# Patient Record
Sex: Male | Born: 1975
Health system: Southern US, Community
[De-identification: ages and names within clinical notes are randomized; demographics above are authoritative.]

## PROBLEM LIST (undated history)

## (undated) DIAGNOSIS — F1010 Alcohol abuse, uncomplicated: Secondary | ICD-10-CM

## (undated) DIAGNOSIS — Z720 Tobacco use: Secondary | ICD-10-CM

## (undated) DIAGNOSIS — M549 Dorsalgia, unspecified: Secondary | ICD-10-CM

## (undated) DIAGNOSIS — G510 Bell's palsy: Secondary | ICD-10-CM

## (undated) HISTORY — DX: Bell's palsy: G51.0

## (undated) HISTORY — DX: Dorsalgia, unspecified: M54.9

---

## 2012-01-15 ENCOUNTER — Encounter (HOSPITAL_COMMUNITY): Payer: Self-pay | Admitting: Emergency Medicine

## 2012-01-15 ENCOUNTER — Emergency Department (HOSPITAL_COMMUNITY)
Admission: EM | Admit: 2012-01-15 | Discharge: 2012-01-15 | Disposition: A | Discharge status: Home / Self Care | Payer: BC Managed Care – PPO | Source: Home / Self Care | Attending: Emergency Medicine | Admitting: Emergency Medicine

## 2012-01-15 DIAGNOSIS — L237 Allergic contact dermatitis due to plants, except food: Secondary | ICD-10-CM

## 2012-01-15 DIAGNOSIS — L255 Unspecified contact dermatitis due to plants, except food: Secondary | ICD-10-CM

## 2012-01-15 MED ORDER — DIPHENHYDRAMINE HCL 25 MG PO CAPS: 50.0000 mg | ORAL_CAPSULE | Freq: Once | ORAL | Status: DC

## 2012-01-15 MED ORDER — DIPHENHYDRAMINE HCL 25 MG PO CAPS
50.0000 mg | ORAL_CAPSULE | Freq: Four times a day (QID) | ORAL | Status: DC | PRN
  Administered 2012-01-15: 50 mg via ORAL

## 2012-01-15 MED ORDER — METHYLPREDNISOLONE SODIUM SUCC 125 MG IJ SOLR
125.0000 mg | Freq: Once | INTRAMUSCULAR | Status: AC
  Administered 2012-01-15: 125 mg via INTRAMUSCULAR

## 2012-01-15 MED ORDER — DIPHENHYDRAMINE HCL 25 MG PO CAPS
ORAL_CAPSULE | ORAL | Status: AC
  Filled 2012-01-15: qty 2

## 2012-01-15 MED ORDER — PREDNISONE 10 MG PO TABS: ORAL_TABLET | ORAL | Status: DC

## 2012-01-15 MED ORDER — METHYLPREDNISOLONE SODIUM SUCC 125 MG IJ SOLR
INTRAMUSCULAR | Status: AC
  Filled 2012-01-15: qty 2

## 2012-01-15 NOTE — ED Provider Notes (Signed)
History     CSN: [id number]  Arrival date & time 01/15/12  1542   First MD Initiated Contact with Patient 01/15/12 1758      Chief Complaint  Patient presents with  . Allergic Reaction  . Poison Ivy    (Consider location/radiation/quality/duration/timing/severity/associated sxs/prior treatment) Patient is a 36 y.o. male presenting with allergic reaction and Poison Ivy. The history is provided by the patient. No language interpreter was used.  Allergic Reaction The primary symptoms are  rash. The current episode started 2 days ago. The problem has been gradually worsening. This is a new problem.  The rash is associated with itching.  The onset of the reaction was associated with exposure to plants. Significant symptoms also include itching.  Poison Ivy  Pt has a full body rash.  Rash began after mowing yard  History reviewed. No pertinent past medical history.  History reviewed. No pertinent past surgical history.  No family history on file.  History  Substance Use Topics  . Smoking status: Current Everyday Smoker  . Smokeless tobacco: Not on file  . Alcohol Use: Yes      Review of Systems  Skin: Positive for itching and rash.  All other systems reviewed and are negative.    Allergies  Review of patient's allergies indicates no known allergies.  Home Medications  No current outpatient prescriptions on file.  BP 116/57  Pulse 74  Temp(Src) 97.8 F (36.6 C) (Oral)  Resp 17  SpO2 96%  Physical Exam  Nursing note and vitals reviewed. Constitutional: He is oriented to person, place, and time. He appears well-developed and well-nourished.  HENT:  Head: Normocephalic.  Eyes: Conjunctivae are normal. Pupils are equal, round, and reactive to light.  Neck: Normal range of motion.  Cardiovascular: Normal rate and normal heart sounds.   Pulmonary/Chest: Effort normal.  Abdominal: Soft.  Neurological: He is alert and oriented to person, place, and time. He has  normal reflexes.  Skin: Rash noted. There is erythema.       Linear areas, red raised,  Scattered blistering  Psychiatric: He has a normal mood and affect.    ED Course  Procedures (including critical care time)  Labs Reviewed - No data to display No results found.   1. Poison ivy       MDM  Solumedrol, benadryl        Lonia Skinner [place], [place] 01/15/12 1802  Lonia Skinner [place], [place] 01/15/12 [address] [place], [place] 01/15/12 [phone]

## 2012-01-15 NOTE — ED Notes (Signed)
PT HERE WITH POSS POISON OAK OR BITE BY UNKNOWN INSECT AFTER CUTTING GRASS Saturday.WIFE STATES HIS FACE WAS VERY SWOLLEN AND EYES WATERY BUT SWELLING HAS DECREASED.REDNESS TO FACE,CHEST AND ARM WITH SMALL RED BUMPS ON HANDS APPEARING LIKE SCABIES.PT TRIED OTC CREAM AND EYE DROPS FOR RELIEF.DENIES SOB

## 2012-01-15 NOTE — ED Notes (Signed)
BENADRYL 50 MG PO GIVEN BUT ORDER PLACED FOR Q 6 PRN.

## 2012-01-15 NOTE — Discharge Instructions (Signed)
Poison Ivy Poison ivy is a inflammation of the skin (contact dermatitis) caused by touching the allergens on the leaves of the ivy plant following previous exposure to the plant. The rash usually appears 48 hours after exposure. The rash is usually bumps (papules) or blisters (vesicles) in a linear pattern. Depending on your own sensitivity, the rash may simply cause redness and itching, or it may also progress to blisters which may break open. These must be well cared for to prevent secondary bacterial (germ) infection, followed by scarring. Keep any open areas dry, clean, dressed, and covered with an antibacterial ointment if needed. The eyes may also get puffy. The puffiness is worst in the morning and gets better as the day progresses. This dermatitis usually heals without scarring, within 2 to 3 weeks without treatment. HOME CARE INSTRUCTIONS  Thoroughly wash with soap and water as soon as you have been exposed to poison ivy. You have about one half hour to remove the plant resin before it will cause the rash. This washing will destroy the oil or antigen on the skin that is causing, or will cause, the rash. Be sure to wash under your fingernails as any plant resin there will continue to spread the rash. Do not rub skin vigorously when washing affected area. Poison ivy cannot spread if no oil from the plant remains on your body. A rash that has progressed to weeping sores will not spread the rash unless you have not washed thoroughly. It is also important to wash any clothes you have been wearing as these may carry active allergens. The rash will return if you wear the unwashed clothing, even several days later. Avoidance of the plant in the future is the best measure. Poison ivy plant can be recognized by the number of leaves. Generally, poison ivy has three leaves with flowering branches on a single stem. Diphenhydramine may be purchased over the counter and used as needed for itching. Do not drive with  this medication if it makes you drowsy.Ask your caregiver about medication for children. SEEK MEDICAL CARE IF:  Open sores develop.   Redness spreads beyond area of rash.   You notice purulent (pus-like) discharge.   You have increased pain.   Other signs of infection develop (such as fever).  Document Released: 08/10/2000 Document Revised: 08/02/2011 Document Reviewed: 06/29/2009 Exit[employer] Patient Information 2012 [employer]. 

## 2012-01-16 NOTE — ED Provider Notes (Signed)
Medical screening examination/treatment/procedure(s) were performed by non-physician practitioner and as supervising physician I was immediately available for consultation/collaboration.   Briggs,Donald Briggs; MD   Donald Wolter Moreno-Coll, MD 01/16/12 1114 

## 2013-12-24 ENCOUNTER — Emergency Department (HOSPITAL_COMMUNITY)
Admission: EM | Admit: 2013-12-24 | Discharge: 2013-12-24 | Disposition: A | Discharge status: Home / Self Care | Payer: BC Managed Care – PPO | Attending: Emergency Medicine | Admitting: Emergency Medicine

## 2013-12-24 ENCOUNTER — Encounter (HOSPITAL_COMMUNITY): Payer: Self-pay | Admitting: Emergency Medicine

## 2013-12-24 ENCOUNTER — Emergency Department (HOSPITAL_COMMUNITY): Payer: BC Managed Care – PPO

## 2013-12-24 ENCOUNTER — Emergency Department (INDEPENDENT_AMBULATORY_CARE_PROVIDER_SITE_OTHER)
Admission: EM | Admit: 2013-12-24 | Discharge: 2013-12-24 | Disposition: A | Discharge status: Nursing Home (Includes ICF) | Payer: BC Managed Care – PPO | Source: Home / Self Care | Attending: Emergency Medicine | Admitting: Emergency Medicine

## 2013-12-24 DIAGNOSIS — R0789 Other chest pain: Secondary | ICD-10-CM

## 2013-12-24 DIAGNOSIS — F10229 Alcohol dependence with intoxication, unspecified: Secondary | ICD-10-CM | POA: Insufficient documentation

## 2013-12-24 DIAGNOSIS — M549 Dorsalgia, unspecified: Secondary | ICD-10-CM | POA: Insufficient documentation

## 2013-12-24 DIAGNOSIS — R071 Chest pain on breathing: Secondary | ICD-10-CM | POA: Insufficient documentation

## 2013-12-24 DIAGNOSIS — K2920 Alcoholic gastritis without bleeding: Secondary | ICD-10-CM | POA: Insufficient documentation

## 2013-12-24 DIAGNOSIS — F17200 Nicotine dependence, unspecified, uncomplicated: Secondary | ICD-10-CM | POA: Insufficient documentation

## 2013-12-24 DIAGNOSIS — R109 Unspecified abdominal pain: Secondary | ICD-10-CM

## 2013-12-24 DIAGNOSIS — F1010 Alcohol abuse, uncomplicated: Secondary | ICD-10-CM

## 2013-12-24 LAB — COMPREHENSIVE METABOLIC PANEL
ALBUMIN: 2.9 g/dL — AB (ref 3.5–5.2)
ALK PHOS: 226 U/L — AB (ref 39–117)
ALT: 61 U/L — AB (ref 0–53)
AST: 115 U/L — ABNORMAL HIGH (ref 0–37)
BUN: 13 mg/dL (ref 6–23)
CO2: 23 mEq/L (ref 19–32)
Calcium: 8.4 mg/dL (ref 8.4–10.5)
Chloride: 97 mEq/L (ref 96–112)
Creatinine, Ser: 0.7 mg/dL (ref 0.50–1.35)
GFR calc Af Amer: >90 mL/min (ref >90)
GFR calc non Af Amer: >90 mL/min (ref >90)
Glucose, Bld: 83 mg/dL (ref 70–99)
POTASSIUM: 4.7 meq/L (ref 3.7–5.3)
SODIUM: 134 meq/L — AB (ref 137–147)
TOTAL PROTEIN: 7.9 g/dL (ref 6.0–8.3)
Total Bilirubin: 0.9 mg/dL (ref 0.3–1.2)

## 2013-12-24 LAB — RAPID URINE DRUG SCREEN, HOSP PERFORMED
Amphetamines: NOT DETECTED
BARBITURATES: NOT DETECTED
BENZODIAZEPINES: NOT DETECTED
COCAINE: NOT DETECTED
Opiates: NOT DETECTED
TETRAHYDROCANNABINOL: NOT DETECTED

## 2013-12-24 LAB — CBC
HCT: 38.1 % — ABNORMAL LOW (ref 39.0–52.0)
Hemoglobin: 12.8 g/dL — ABNORMAL LOW (ref 13.0–17.0)
MCH: 30.3 pg (ref 26.0–34.0)
MCHC: 33.6 g/dL (ref 30.0–36.0)
MCV: 90.3 fL (ref 78.0–100.0)
PLATELETS: 83 10*3/uL — AB (ref 150–400)
RBC: 4.22 MIL/uL (ref 4.22–5.81)
RDW: 15.9 % — ABNORMAL HIGH (ref 11.5–15.5)
WBC: 5.3 10*3/uL (ref 4.0–10.5)

## 2013-12-24 LAB — LIPASE, BLOOD: LIPASE: 53 U/L (ref 11–59)

## 2013-12-24 LAB — ETHANOL: Alcohol, Ethyl (B): 14 mg/dL — ABNORMAL HIGH (ref 0–11)

## 2013-12-24 LAB — I-STAT TROPONIN, ED: TROPONIN I, POC: 0 ng/mL (ref 0.00–0.08)

## 2013-12-24 MED ORDER — ONDANSETRON HCL 4 MG PO TABS: 4.0000 mg | ORAL_TABLET | Freq: Three times a day (TID) | ORAL | Status: DC | PRN

## 2013-12-24 MED ORDER — SODIUM CHLORIDE 0.9 % IV BOLUS (SEPSIS)
1000.0000 mL | Freq: Once | INTRAVENOUS | Status: AC
  Administered 2013-12-24: 1000 mL via INTRAVENOUS

## 2013-12-24 MED ORDER — IBUPROFEN 400 MG PO TABS: 600.0000 mg | ORAL_TABLET | Freq: Three times a day (TID) | ORAL | Status: DC | PRN

## 2013-12-24 MED ORDER — PANTOPRAZOLE SODIUM 20 MG PO TBEC: 20.0000 mg | DELAYED_RELEASE_TABLET | Freq: Every day | ORAL | Status: DC

## 2013-12-24 MED ORDER — ALUM & MAG HYDROXIDE-SIMETH 200-200-20 MG/5ML PO SUSP: 30.0000 mL | ORAL | Status: DC | PRN

## 2013-12-24 MED ORDER — ONDANSETRON HCL 4 MG/2ML IJ SOLN
4.0000 mg | Freq: Once | INTRAMUSCULAR | Status: AC
  Administered 2013-12-24: 4 mg via INTRAVENOUS
  Filled 2013-12-24: qty 2

## 2013-12-24 MED ORDER — NICOTINE 21 MG/24HR TD PT24: 21.0000 mg | MEDICATED_PATCH | Freq: Every day | TRANSDERMAL | Status: DC

## 2013-12-24 MED ORDER — LORAZEPAM 1 MG PO TABS: 1.0000 mg | ORAL_TABLET | Freq: Three times a day (TID) | ORAL | Status: DC | PRN

## 2013-12-24 MED ORDER — ZOLPIDEM TARTRATE 5 MG PO TABS: 5.0000 mg | ORAL_TABLET | Freq: Every evening | ORAL | Status: DC | PRN

## 2013-12-24 MED ORDER — KETOROLAC TROMETHAMINE 30 MG/ML IJ SOLN
30.0000 mg | Freq: Once | INTRAMUSCULAR | Status: AC
  Administered 2013-12-24: 30 mg via INTRAVENOUS
  Filled 2013-12-24: qty 1

## 2013-12-24 MED ORDER — ONDANSETRON 4 MG PO TBDP: ORAL_TABLET | ORAL | Status: DC

## 2013-12-24 NOTE — ED Provider Notes (Signed)
Medical screening examination/treatment/procedure(s) were performed by non-physician practitioner and as supervising physician I was immediately available for consultation/collaboration.  Leslee Homeavid Gaylon Bentz, M.D.  Reuben Likesavid C Sheryle Vice, MD 12/24/13 [phone]

## 2013-12-24 NOTE — ED Notes (Signed)
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

## 2013-12-24 NOTE — ED Provider Notes (Signed)
CSN: [id number]     Arrival date & time 12/24/13  1045 History   First MD Initiated Contact with Patient 12/24/13 1140     Chief Complaint  Patient presents with  . Chest Pain     (Consider location/radiation/quality/duration/timing/severity/associated sxs/prior Treatment) HPI Comments: Patient is a 38 year old Hispanic male with a past medical history of alcohol abuse for the past 10 years who was to the emergency department from urgent care Center with his wife for evaluation of possible alcoholic gastritis and/or pancreatitis and medical clearance for alcohol detox program. He went to urgent care complaining of left-sided chest wall discomfort, it was concluded that it is chest wall pain. Chest pain has been present for 3 days, he works at a [employer] and lives heavy objects throughout the day. Pain worse when he moves his left arm or lifts anything. Denies associated shortness breath. States he's been drinking alcohol for the past 10 years, he has 3-4 40 ounce beers daily. Denies drug use. He reports he would like to attend day treatment facility for his alcohol abuse. Currently he is complaining of severe upper abdominal pain for the past 4 days. Pain radiates across his upper abdomen into the right side of his back. Admits to associated nausea and multiple episodes of nonbloody emesis. Admits to diarrhea that floats to the top of the toilet. Denies fever or chills. No suicidal or homicidal ideations.  Patient is a 38 y.o. male presenting with chest pain. The history is provided by the patient and the spouse. The history is limited by a language barrier. A language interpreter was used.  Chest Pain Associated symptoms: abdominal pain, back pain, nausea and vomiting     History reviewed. No pertinent past medical history. History reviewed. No pertinent past surgical history. History reviewed. No pertinent family history. History  Substance Use Topics  . Smoking status: Current Every  Day Smoker  . Smokeless tobacco: Not on file  . Alcohol Use: Yes    Review of Systems  Constitutional: Positive for appetite change.  Cardiovascular: Positive for chest pain.  Gastrointestinal: Positive for nausea, vomiting, abdominal pain and diarrhea.  Musculoskeletal: Positive for back pain.  Psychiatric/Behavioral:       Positive for alcohol abuse.  All other systems reviewed and are negative.     Allergies  Review of patient's allergies indicates no known allergies.  Home Medications   Prior to Admission medications   Not on File   BP 141/87  Pulse 64  Temp(Src) 99.4 F (37.4 C) (Oral)  Resp 22  SpO2 98% Physical Exam  Nursing note and vitals reviewed. Constitutional: He is oriented to person, place, and time. He appears well-developed and well-nourished. No distress.  HENT:  Head: Normocephalic and atraumatic.  Mouth/Throat: Oropharynx is clear and moist.  Eyes: Conjunctivae are normal.  Neck: Normal range of motion. Neck supple.  Cardiovascular: Normal rate, regular rhythm and normal heart sounds.   Pulmonary/Chest: Effort normal and breath sounds normal.    Abdominal: Soft. Bowel sounds are normal. He exhibits no distension. There is tenderness in the right upper quadrant, epigastric area and left upper quadrant. There is guarding and positive Murphy's sign. There is no rigidity and no rebound.  No peritoneal signs.  Musculoskeletal: Normal range of motion. He exhibits no edema.  Neurological: He is alert and oriented to person, place, and time.  Skin: Skin is warm and dry. He is not diaphoretic.  Psychiatric: He has a normal mood and affect. His behavior is  normal.    ED Course  Procedures (including critical care time) Labs Review Labs Reviewed  CBC - Abnormal; Notable for the following:    Hemoglobin 12.8 (*)    HCT 38.1 (*)    RDW 15.9 (*)    Platelets 83 (*)    All other components within normal limits  COMPREHENSIVE METABOLIC PANEL -  Abnormal; Notable for the following:    Sodium 134 (*)    Albumin 2.9 (*)    AST 115 (*)    ALT 61 (*)    Alkaline Phosphatase 226 (*)    All other components within normal limits  ETHANOL - Abnormal; Notable for the following:    Alcohol, Ethyl (B) 14 (*)    All other components within normal limits  LIPASE, BLOOD  URINE RAPID DRUG SCREEN (HOSP PERFORMED)  I-STAT TROPOININ, ED    Imaging Review Dg Chest 2 View  12/24/2013   CLINICAL DATA:  Chest pain  EXAM: CHEST  2 VIEW  COMPARISON:  None.  FINDINGS: The heart size and mediastinal contours are within normal limits. Both lungs are clear. The visualized skeletal structures are unremarkable.  IMPRESSION: No active cardiopulmonary disease.   Electronically Signed   By: Elige KoHetal  Patel   On: 12/24/2013 12:35   [place] Abdomen Complete  12/24/2013   CLINICAL DATA:  Atypical chest pain.  Epigastric pain.  EXAM: ULTRASOUND ABDOMEN COMPLETE  COMPARISON:  None.  FINDINGS: Gallbladder:  No gallstones or wall thickening visualized. No sonographic Murphy sign noted.  Common bile duct:  Diameter: 4 mm  Liver:  No focal lesion identified. Within normal limits in parenchymal echogenicity.  IVC:  No abnormality visualized.  Pancreas:  Visualized portion unremarkable.  Spleen:  Size and appearance within normal limits.  Right Kidney:  Length: 11.7 cm. Echogenicity within normal limits. No mass or hydronephrosis visualized.  Left Kidney:  Length: 11.0 cm. Echogenicity within normal limits. No mass or hydronephrosis visualized.  Abdominal aorta:  No aneurysm visualized.  Other findings:  None.  IMPRESSION: Negative. No evidence of gallstones, hydronephrosis, or other significant abnormality.   Electronically Signed   By: Myles RosenthalJohn  Stahl M.D.   On: 12/24/2013 15:57     EKG Interpretation None      MDM   Final diagnoses:  Alcoholic gastritis  Alcohol abuse  Chest wall pain   [facility]coholic gast[facility]. He appears in NAD.  Temp 99.4, vitals otherwise stable. Labs obtained prior to patient being seen, normal lipase, however elevated liver functions. Tenderness right upper quadrant, epigastric or positive Murphy sign. Will obtain abdominal ultrasound. Once medically cleared, will consult TTS for evaluation. 4:18 PM Abdominal ultrasound negative. Patient's pain has improved, abdomen still slightly tender midepigastric area. Most likely alcoholic gastritis. Patient medically cleared for evaluation for detox. 5:46 PM When speaking with Paige at behavioral health, patient reports that [place] He needs a note for work that he is medically cleared to attend to this facility. Stable for discharge, patient is not a harm to himself or others. Zofran and Protonix prescriptions given. Return precautions given. Patient states understanding of treatment care plan and is agreeable.  Case discussed with attending Dr. Bernette MayersSheldon who agrees with plan of care.   Trevor Maceobyn M Albert, PA-C 12/24/13 1747  Trevor Maceobyn M Albert, PA-C 12/24/13 [phone]

## 2013-12-24 NOTE — ED Notes (Signed)
Notified pt that he will need to change into blue paper scrubs and will be wanded by security. Pt's wife will take his belongings home with her.

## 2013-12-24 NOTE — ED Notes (Addendum)
Behavior Health called to schedule assessment at 16:55. Computer brought to patient's room at this time. ACT team made aware pt does not speak [place]

## 2013-12-24 NOTE — ED Provider Notes (Signed)
CSN: [id number]     Arrival date & time 12/24/13  0932 History   First MD Initiated Contact with Patient 12/24/13 1012     Chief Complaint  Patient presents with  . Chest Pain   (Consider location/radiation/quality/duration/timing/severity/associated sxs/prior Treatment) HPI Comments: Patient presents with 3 separate issues for evaluation. First, he states he has had 3 days of left sided chest wall discomfort, primarily at left pectoralis muscle. States he works in a [employer] and performs heavy lifting all day while at work. Feels he has strained his chest wall. Denies dyspnea or diaphoresis. Symptoms are exacerbated by use of left upper extremity or chest muscles for lifting. Next he requests placement in inpatient substance abuse program. Has abused alcohol for >10 years, drinking at least 3-4, 40oz beers a day. Reports recent DUI (Dec. 2014) and was advised by judge to enter treatment. Last drink within the past 12 hours. Is a smoker but denies illicit drug use. Has never received treatment before. States he has daily nosebleeds and feels that this is related to his alcohol use Lastly he is concerned about several days of epigastric abdominal pain with associated nausea and vomiting. Pain is constant and radiates through to his back. Denies previous abdominal surgery or previous episodes of same. No fever or diarrhea. Pain is exacerbated by movement. Denies GU sx.   The history is provided by the patient and the spouse. The history is limited by a language barrier. A language interpreter was used.    History reviewed. No pertinent past medical history. History reviewed. No pertinent past surgical history. No family history on file. History  Substance Use Topics  . Smoking status: Current Every Day Smoker  . Smokeless tobacco: Not on file  . Alcohol Use: Yes    Review of Systems  Constitutional: Negative.   HENT: Positive for nosebleeds.   Eyes: Negative.   Respiratory:  Negative.   Cardiovascular: Positive for chest pain.       See HPI  Gastrointestinal: Positive for nausea, vomiting and abdominal pain. Negative for diarrhea, constipation, blood in stool and anal bleeding.  Endocrine: Negative for polydipsia, polyphagia and polyuria.  Genitourinary: Negative.   Musculoskeletal: Positive for back pain.  Skin: Negative.   Neurological: Negative.   Hematological: Bruises/bleeds easily.  Psychiatric/Behavioral:       See HPI    Allergies  Review of patient's allergies indicates no known allergies.  Home Medications   Prior to Admission medications   Medication Sig Start Date End Date Taking? Authorizing Provider  predniSONE (DELTASONE) 10 MG tablet 6,6,5,5,4,4,3,3,2,2,1,1 taper 01/15/12   Lonia SkinnerLeslie K Sofia, PA-C   BP 118/73  Pulse 88  Temp(Src) 100.8 F (38.2 C) (Oral)  Resp 14  SpO2 98% Physical Exam  Nursing note and vitals reviewed. Constitutional: He is oriented to person, place, and time. He appears well-developed and well-nourished. No distress.  +febrile  HENT:  Head: Normocephalic and atraumatic.  Nose: Nose normal. No epistaxis.  Eyes: Conjunctivae are normal. No scleral icterus.  Cardiovascular: Normal rate, regular rhythm and normal heart sounds.   Pulmonary/Chest: Effort normal and breath sounds normal. No respiratory distress. He has no wheezes. He exhibits tenderness.  +tenderness with palpation of left pectoralis muscle  Abdominal: Soft. Normal appearance and bowel sounds are normal. He exhibits no distension, no pulsatile liver, no ascites and no mass. There is no hepatosplenomegaly. There is tenderness in the right upper quadrant and epigastric area. There is no rigidity, no rebound, no guarding, no CVA  tenderness, no tenderness at McBurney's point and negative Murphy's sign. No hernia. Hernia confirmed negative in the ventral area.  Musculoskeletal: Normal range of motion.  Neurological: He is alert and oriented to person, place,  and time.  Skin: Skin is warm and dry. No rash noted. No erythema.  Psychiatric: He has a normal mood and affect. His behavior is normal.    ED Course  Procedures (including critical care time) Labs Review Labs Reviewed - No data to display  Imaging Review No results found.   MDM   1. Chest wall pain   2. Abdominal pain   3. Alcohol abuse    Will need to proceed to ER for evaluation for possible pancreatitis and/or alcoholic gastritis and medical clearance for possible alcohol detox program. Chest discomfort would appear to be of musculoskeletal origin. ECG: NSR @ 76 BPM and without acute ST/T wave changes or ectopy No clinical evidence of DTs.   Jess BartersJennifer Lee [place], [place] 12/24/13 [phone]

## 2013-12-24 NOTE — Discharge Instructions (Signed)
Tome protonix segn las indicaciones. Tome zofran segn las indicaciones segn sea necesario para las nuseas .  Problemas Con El Alcohol (Alcohol Problems) La mayora de los adultos que beben alcohol lo hacen con moderacin (no demasiado) y poseen bajo riesgo de Warehouse manager problemas relacionados con la bebida. Sin embargo, todos los bebedores, inclusive los de bajo riesgo, Gaffer los riesgos de la salud que conlleva la ingesta de alcohol. RECOMENDACIONES PARA LOS BEBEDORES DE BAJO RIESGO Beber con moderacin. La ingesta moderada de alcohol se establece de la siguiente manera:   Hombres  no ms de dos tragos Google.  Mujeres  no ms de un trago Google.  Mayores de 65 aos  no ms de un trago Air cabin crew. Un trago estndar es 12 gramos de alcohol puro, lo que es lo mismo que una botella de 12 onzas de cerveza o Merchant navy officer, un vaso de vino de 5 onzas, o 1 onza y media de bebidas blancas (como whisky, brandy, vodka, o ron).  ABSTNGASE (NO BEBA) ALCOHOL:  Si est embarazada o contempla la posibilidad.  Cuando tome un medicamento que interacta con el alcohol.  Si usted es dependiente del alcohol.  Sufre alguna enfermedad en la que se prohba el consumo de alcohol (como lceras, enfermedades hepticas, o enfermedades cardacas). HABLE CON EL MDICO:  Si tiene riesgo de sufrir una enfermedad coronaria, converse sobre los potenciales beneficios y riesgos de la ingesta de alcohol: La ingesta baja a moderada de alcohol est asociada con tasas menores de enfermedades coronarias en ciertas poblaciones (por ejemplo, hombres mayores de 45 aos y mujeres postmenopusicas). Se aconseja a las personas no bebedoras o abstemias no comenzar con la ingesta baja a moderada para reducir el riesgo de enfermedades coronarias en funcin de evitar la aparicin de un problema relacionado con el alcohol. Pueden obtenerse efectos protectores similares a travs de una dieta y ejercicios adecuados.  Las mujeres y los  ancianos tienen menos cantidad de agua en el [employer]. Como consecuencia de [place], las mujeres y los ancianos tienen mayor concentracin de alcohol en la sangre despus de beber la misma cantidad de alcohol.  La exposicin del feto al alcohol puede ocasionar una gran cantidad de defectos de nacimientos dominados [place] Alcohlico Fetal (FAS) o Defectos de Nacimiento Relacionados con el Alcohol (ARBD). Aunque los FAS y los ARBD estn asociados con el consumo excesivo de alcohol durante el [place]­, tambin se han informado trastornos de Leisure centre manager en nios nacidos de madres que informaron haber bebido un trago en promedio por Mudlogger.  El abuso de alcohol (como el consumo de ms de cuatro tragos por ocasin en hombres y ms de tres tragos por ocasin en mujeres) perjudica a lo cognitivo (aprendizaje) y las funciones psicomotoras e incrementa el riesgo de problemas relacionados con el alcohol, inclusive accidentes y daos. PREGUNTAS CECA:   Algunas vez ha sentido que deba cortar con la bebida?  Se ha enojado usted con alguien que lo critic por lo que bebe?  Alguna vez se ha sentido mal o culpable por lo que bebe?  Ha tomado alguna vez un trago por la maana para calmar sus nervios o para deshacerse de una "resaca" (para "abrir los ojos")? Si ha respondido de [place] positiva a [place] de estas preguntas: Podra estar en riesgo de tener problemas relacionados con el alcohol si el consumo del mismo es:   Hombres: Mayor a 14 tragos por semana o ms de 4 tragos por ocasin.  Mujeres:  Mayor a 7 tragos por semana o ms de 3 tragos por ocasin. Tiene usted o su familia alguna historia clnica de problemas relacionados con el alcohol como:  Prdida del conocimiento.  Disfuncin sexual.  Depresin.  Traumatismos.  Enfermedades hepticas.  Trastornos del sueo.  Hipertensin arterial.  Dolor crnico abdominal.  Alguna vez el beber le ha ocasionado  problemas, como por ejemplo con su familia, en su rendimiento laboral (o escolar), accidentes o lesiones?  Ha tenido una compulsin a beber o se ha preocupado mientras lo haca?  Posee poco control o es incapaz de parar de beber una vez que ha comenzado?  Ha tenido que beber para evitar sntomas de abstinencia?  Ha tenido problemas con la abstinencia como temblores, nuseas, sudor o cambios en el humor?  Necesita ms alcohol que antes para emborracharse?  Siente una fuerte necesidad de beber?  Cambia de planes para poder beber?  Alguna vez ha bebido en la maana para aliviar temblores o resaca? Si ha respondido a Jerseyalguna de estas preguntas de Emerson Electricmanera positiva, puede que sea el momento de hablar con un profesional, familiar o amigos y ver si ellos creen que tiene un problema. El alcoholismo es una dependencia qumica que puede Theme park managerempeorar y Environmental health practitionerllegar a destruir su salud y Teacher, English as a foreign languagesus relaciones. Muchos alcohlicos mueren, se empobrecen o terminan en prisin. Esto es a menudo el resultado de una dependencia qumica.  No se desaliente si no est listo para actuar inmediatamente.  Las decisiones para cambiar su comportamiento a menudo implican altas y bajas entre el deseo de Saint Barthelemycambiar y la sensacin de que no puede decidirse.  Intente pensar ms seriamente sobre su comportamiento frente a la bebida.  Piense en razones para dejarla. PARA OBTENER INFORMACIN ADICIONAL, CONCURRIR A:  The General Millsational Institute on Alcohol Abuse and Alcoholism (NIAAA) BasicStudents.dkwww.niaaa.nih.gov   ToysRusational Council on Alcoholism and Drug Dependence (NCADD) www.ncadd.org  American Society of Addiction Medicine (ASAM) RoyalDiary.glwww.asam.org  Document Released: 11/20/2007 Document Revised: 11/05/2011 [facility] Patient Information 2014 [place]ExitCare, [place]LLC.  Sndrome de abstinencia alcohlica (Alcohol Withdrawal) Cuando el consumo de drogas interfiere con las actividades normales de la vida se convierte en abuso. Esto incluye problemas con la  familia y los amigos. La dependencia psicolgica existe cuando su mente le dice que necesita la droga. Esto generalmente es seguido por la dependencia fsica, que se produce al aumentar de Regions Financial Corporationmanera continua la cantidad de droga necesaria para Systems analystalcanzar el mismo efecto o el estado de "euforia". Esto se conoce como adiccin o dependencia qumica. El riesgo de [place]a persona es mucho mayor si existen antecedentes de dependencia de qumicos en la familia. Sndrome de abstinencia leve al abandonar el consumo de alcohol cuando se ha desarrollado la adiccin o la dependencia qumica. Cuando una persona desarrolla la tolerancia al alcohol y de modo repentino deja de consumirlo, puede sufrir sntomas fsicos molestos. Geralmente son leves y Art therapistconsisten en temblores en las manos y aumento de la frecuencia cardiaca, de la respiracin y de Retail buyerla temperatura. Algunas veces estos sntomas se asocian a una sensacin de ansiedad, crisis de [place]angustia y pesadillas. Tambin Lobbyistpuede haber malestar de Lake Californiaestmago. Los patrones normales de sueo generalmente se interrumpen con perodos de insomnio (imposibilidad para dormir). Estos sntomas pueden tener una duracin de 6 meses. Debido a estas molestias, muchas personas eligen continuar bebiendo para evitarlas y para tratar de sentirse normal.  Sndrome de abstinencia grave ante la disminucin de la ingesta o sin consumo de alcohol, cuando se ha desarrollado la adiccin o la dependencia qumica. Alrededor  del cinco por ciento de los alcohlicos desarrollan signos de abstinencia grave cuando interrumpen el consumo de alcohol. Uno de los signos es la aparicin de convulsiones generalizadas Otros signos son agitacin grave y confusin. Esto puede asociarse a las ideas delirantes y alucinaciones (la creencia en cosas que no son reales o ver cosas que no existen). Si la ingesta de alcohol se realiz durante un tiempo prolongado, puede haber deficiencias vitamnicas. Generalmente el tratamiento para esto  requiere la hospitalizacin y un seguimiento intenso. Slo se puede ayudar a una persona que padece una adiccin si suspende el consumo de todas las sustancias qumicas. Esto es difcil de llevar a cabo, pero puede salvar su vida. Algunas consecuencias probables del consumo continuo de alcohol son la prdida de la autoestima, la violencia y Musician. La adiccin no puede curarse pero puede detenerse. A menudo esto requiere ayuda externa y la atencin profesional. Los centros de tratamiento estn enumerados en las pginas amarillas bajo el rubro: [place], Narcticos y Alcohlicos Annimos. Casi todos los hospitales y clnicas pueden derivarlo a un centro de atencin especializada. No es necesario que usted soporte los sntomas desagradables de la abstinencia. El profesional que lo asiste puede proporcionarle la medicacin que lo ayudar a Engineer, agricultural este perodo difcil. Trate de evitar las situaciones, los amigos o las drogas que hicieron posible que usted consumiera alcohol en el pasado. Aprenda a decir que no. Lleva un largo tiempo superar las adicciones a cualquier droga, incluso al alcohol. Puede haber momentos en los que usted sienta que necesita beber. Despus de liberarse de la adiccin fsica y del sndrome de abstinencia, sentir una disminucin en el deseo intenso que le indica que usted necesita alcohol para sentirse normal. Comunquese con el profesional que lo asiste si necesita ms apoyo Aprenda a Public house manager con quien debe hablar dentro de su familia y entre sus [place], de modo que durante estos perodos pueda recibir ayuda externa. AA (Alcohlicos Annimos) ha ayudado a Agricultural engineer. Para obtener ms ayuda contctese con AA o comunquese con el profesional que lo asiste, consejero o sacerdote. Al-Anon y Alateen son grupos de ayuda para amigos y familiares de Optometrist. Las [employer] Lilly and [employer] aman y cuidan a los adictos al alcohol tambin [place]. Para obtener  informacin acerca de estas organizaciones, bsquelas en la gua telefnica de su localidad o llame a un centro local para el tratamiento del alcoholismo.  SOLICITE ATENCIN MDICA DE INMEDIATO SI:  Sufre convulsiones.  Sube la fiebre.  Sufre vmitos incontrolables o vomita sangre. Puede ser sangre de color rojo brillante o similar a borra de caf.  Maxie Better en la materia fecal. Puede ser de color rojo brillante o de aspecto alquitranado, con olor ftido.  Si se siente confundido o desfalleciente. No conduzca si se siente de Sc[employer]. Deje que alguien conduzca por usted o llame al 911 para solicitar ayuda.  Se torna agitado o presenta un estado de confusin.  Desarrolla una ansiedad incontrolable.  Los pacientes pueden sufrir alucinaciones (ver, Tax adviser o sentir cosas que en realidad no existen). El profesional que lo asiste ha determinado que usted comprende plenamente su problema mdico y que su estado mental ha vuelto a la normalidad Comprende que ha recibido un tratamiento para el sndrome de abstinencia por alcohol, ha aceptado no beber alcohol al menos por un da, que no conducir un automvil ni manipular maquinarias durante al menos 24 horas y que ha tenido la oportunidad de formular todas las preguntas necesarias  acerca de su problema. Document Released: 08/13/2005 Document Revised: 11/05/2011 [facility] Patient Information 2014 [place] CityExitCare, [place]LLC.  Dependencia qumica  (Chemical Dependency)  La dependencia qumica es una forma de adiccin a las drogas o al alcohol. Se caracteriza por la conducta repetida de bsqueda y Italyuso de drogas y alcohol a pesar de las consecuencias dainas para la salud y la seguridad propia y Engineer, agriculturalla de Economistotras personas.  FACTORES DE RIESGO  Ciertas situaciones o conductas aumentan el riesgo de sufrir dependencia qumica. Ellos son:   Scheryl DarterHistoria familiar de dependencia qumica.  Historia de trastornos Deep Rivermentales, como depresin o ansiedad.  Ambiente familiar  en los que las drogas y el alcohol estn fcilmente disponibles.  Consumo de alcohol o drogas a edades tempranas. SNTOMAS  Los siguientes sntomas indican dependencia qumica:   Imposibilidad para poner lmite al consumo.  Nuseas, transpiracin, temblores y ansiedad que aparecen cuando no se consume.  Aumento en la cantidad de droga o alcohol necesaria para emborracharse o lograr la euforia provocada por la droga. Las Education officer, museumpersonas que sienten estos sntomas pueden evaluar su dependencia, Frontier Oil Corporationhacindose las siguientes preguntas:   Sus familiares o amigos le han dicho que estn preocupados por su consumo de alcohol o drogas?  Alguna vez le contaron las cosas que usted haca bajo los efectos del consumo, y que no recuerda?  Miente con respecto al consumo o en las cantidades que Whitehawkutiliza?  Tiene dificultad para Education officer, environmentalrealizar las tareas de CarMaxtodos los das si no consume?  Su rendimiento en el trabajo o la escuela es menor debido a su consumo?  Se siente mal al consumir pero lo sigue haciendo?  Si no consume, se siente incmodo en situaciones sociales?  Consume para olvidar problemas? Si la respuesta es positiva a alguna de estas preguntas, indica que tiene dependencia qumica. Le sugerimos que realice una evaluacin con un profesional.  Document Released: 08/13/2005 Document Revised: 11/05/2011 [facility] Patient Information 2014 [place]ExitCare, [place]LLC.  Dolor de la pared torcica (Chest Wall Pain) Dolor en la pared torcica es dolor en o alrededor de los huesos y msculos de su pecho. Podrn pasar hasta 6 semanas hasta que comience a mejorar. Puede demorar ms tiempo si es fsicamente activo en su Aleen Campitrabajo y Rohrsburgactividades.  CAUSAS  El dolor en el pecho puede aparecer sin motivo. No obstante, algunas causas pueden ser:   Neomia DearUna enfermedad viral como la gripe.  Traumatismos.  Tos.  La prctica de ejercicios.  Artritis.  Fibromialgia  Culebrilla. INSTRUCCIONES PARA EL CUIDADO  DOMICILIARIO  Evite hacer actividad fsica extenuante. Trate de no esforzarse o Electrical engineerrealizar actividades que le causen dolor. Aqu se incluyen las actividades en las que Botswanausa los msculos del trax, los abdominales y los msculos laterales, especialmente si debe levantar objetos pesados.  Aplique hielo sobre la zona dolorida.  Ponga el hielo en una bolsa plstica.  Colquese una toalla entre la piel y la bolsa de hielo.  Deje la bolsa de hielo durante 15 a 20 minutos por hora, durante los primeros 2 [address]  [address].  Utilice los medicamentos de venta libre o de prescripcin para Chief Technology Officerel dolor, Environmental health practitionerel malestar o la McMillinfiebre, segn se lo indique el profesional que lo asiste. SOLICITE ATENCIN MDICA DE INMEDIATO SI:  El dolor aumenta o siente muchas molestias.  Tiene fiebre.  El dolor de El Cerropecho empeora.  Desarrolla nuevos e inexplicables sntomas.  Tiene nuseas o vmitos.  Berenice Primasranspira o se siente mareado.  Tiene tos con flema (esputo), o tose con sangre. EST SEGURO QUE:   Comprende las instrucciones para  el alta mdica.  Controlar su enfermedad.  Solicitar atencin mdica de inmediato segn las indicaciones. Document Released: 09/24/2006 Document Revised: 11/05/2011 [facility] Patient Information 2014 [place], [place].  Gastritis - Adultos  (Gastritis, Adult)  La gastrittis es la irritacin (inflamacin) de la membrana interna del estmago. Puede ser Neomia Dear enfermedad de inicio sbito (aguda) o de largo plazo (crnica). Si la gastritis no se trata, puede causar sangrado y lceras. CAUSAS  La gastritis se produce cuando la membrana que tapiza interiormente al estmago se debilita o se daa. Los jugos digestivos del estmago inflaman el revestimiento del estmago debilitado. El revestimiento del estmago puede debilitarse o daarse por una infeccin viral o bacteriana. La infeccin bacteriana ms comn es la infeccin por Helicobacter pylori. Tambin puede ser el resultado del consumo excesivo de alcohol, por  el uso de ciertos medicamentos o porque hay demasiado cido en el estmago.  SNTOMAS  En algunos casos no hay sntomas. Si se presentan sntomas, stos pueden ser:   Dolor o sensacin de ardor en la parte superior del abdomen.  Nuseas.  Vmitos.  Sensacin molesta de distensin despus de comer. DIAGNSTICO  El mdico puede diagnosticar gastritis segn los sntomas y el examen fsico. Para determinar la causa de la gastritis, el mdico podr:   Pedir anlisis de sangre o de materia fecal para diagnosticar la presencia de la bacteria H pylori.  Gastroscopa. Un tubo delgado y flexible (endoscopio) se pasa por Theatre stage manager al [employer] and An[employer]ity [employer]. El endoscopio tiene [place] luz y una cmara en el extremo. El mdico utilizar el endoscopio para observar el interior del Gravity.  Tomar una muestra de tejido (biopsia) del estmago para examinarlo en el microscopio. TRATAMIENTO  Segn la causa de la gastritis podrn recetarle: Antibiticos, si la causa es una infeccin bacteriana, como una infeccin por H. pylori. Anticidos o bloqueadores H2, si hay demasiado cido en el estmago. El [employer] aconsejar que deje de tomar aspirina, ibuprofeno u otros antiinflamatorios no esteroides (AINE).  INSTRUCCIONES PARA EL CUIDADO EN EL HOGAR   Tome slo medicamentos de venta libre o recetados, segn las indicaciones del mdico.  Si le han recetado antibiticos, tmelos segn las indicaciones. Tmelos todos, aunque se sienta mejor.  Debe ingerir gran cantidad de lquido para mantener la orina de tono claro o color amarillo plido.  Evite las comidas y bebidas que [address] [place], [place]:  [place] con cafena o alcohlicas.  Chocolate.  Sabores a Advertising account planner.  Ajo y cebolla.  Comidas muy condimentadas.  Ctricos como naranjas, limones o limas.  Alimentos que contengan tomate, como salsas, [place] y pizza.  Alimentos fritos y Lexicographer.  Haga comidas pequeas durante Glass blower/designer de 3  comidas abundantes. SOLICITE ATENCIN MDICA DE INMEDIATO SI:   La materia fecal es negra o de color rojo oscuro.  Vomita sangre de color rojo brillante o material similar a granos de caf.  No puede retener los lquidos.  El dolor abdominal empeora.  Tiene fiebre.  No mejora luego de 1 semana.  Tiene preguntas o preocupaciones. ASEGRESE DE QUE:   Comprende estas instrucciones.  Controlar su enfermedad.  Solicitar ayuda de inmediato si no mejora o si empeora. Document Released: 05/23/2005 Document Revised: 05/07/2012 [facility] Patient Information 2014 [place], [place].

## 2013-12-24 NOTE — ED Provider Notes (Signed)
Medical screening examination/treatment/procedure(s) were performed by non-physician practitioner and as supervising physician I was immediately available for consultation/collaboration.  [facility]     Charles B. Bernette MayersSheldon, MD 12/24/13 [phone]

## 2013-12-24 NOTE — ED Notes (Signed)
Pt states his right lower abd hurts as well. Pt states it hurts a lot worse when he has a beer.

## 2013-12-24 NOTE — ED Notes (Signed)
[facility]. Pt having left side chest wall pains x 3 days. Pain radiates around to side and back, increases with movement. Pt reprots heavy lifting at work. Having fever/chills/cough since yesterday. No acute distress noted at triage.

## 2013-12-24 NOTE — BH Assessment (Signed)
Tele Assessment Note   Donald Briggs is an 38 y.o. male. Pt presents voluntarily to [facility] accompanied by his wife. Pt speaks Spanish only and his wife speaks [place] and [place]. Writer conducted [employer]essment by doing conference call between Clinical research associatewriter, pt and [employer] [phone] with interpreter Peyton NajjarDamian [phone]. Pt denies SI and HI. [facility] denies [facility] and no delusions noted. Pt reports he needs to be medically detoxed in order to enter a 30 day treatment program. Pt sts he forgot name of program as his belongings are now secured on the unit. Pt sts program told him they would accept him once he had detoxed. Pt's BAL was 14 upon admission . Pt sts he drinks approx. two to three 24 oz beers daily. Pt denies of seizures or hx of DTs. Pt reports no withdrawal symptoms. He sts that he is going to the 30-day program b/c he recently received a second DUI. Pt st this is first time he is seeking any type of alcohol treatment and his family is supportive. Pt sts his father is addicted to alcohol. Pt reports that he and his wife have 5 sons and they need to get back home tonight b/c they don't have childcare. Pt sts he also has to be at work tomorrow. Pt has no hx of outpatient and inpatient treatment. Pt moved to [place] 5 years ago. Writer ran pt by Claudette Headonrad Withrow who agrees that pt doesn't need inpatient MH treatment as he doesn't meet inpt criteria. Pt sts he will go to 30 day program on Monday.   Axis I: Alcohol Use Disorder, Moderate Axis II: Deferred Axis III: History reviewed. No pertinent past medical history. Axis IV: other psychosocial or environmental problems and problems related to social environment Axis V: 51-60 moderate symptoms  Past Medical History: History reviewed. No pertinent past medical history.  History reviewed. No pertinent past surgical history.  Family History: History reviewed. No pertinent family history.  Social History:  reports that he has been smoking.  He does not  have any smokeless tobacco history on file. He reports that he drinks alcohol. He reports that he does not use illicit drugs.  Additional Social History:  Alcohol / Drug Use Pain Medications: pt denies abuse Prescriptions: pt denies abuse Over the Counter: pt denies abuse History of alcohol / drug use?: Yes Negative Consequences of Use: Personal relationships;Legal Substance #1 Name of Substance 1: alcohol 1 - Age of First Use: 16 1 - Amount (size/oz): two to three 24-oz beers 1 - Frequency: daily 1 - Duration: for 10 years 1 - Last Use / Amount: 12/23/13 - two 24 oz beers  CIWA: CIWA-Ar BP: 141/87 mmHg Pulse Rate: 64 COWS:    Allergies: No Known Allergies  Home Medications:  (Not in a hospital admission)  OB/GYN Status:  No LMP for male patient.  General Assessment Data [facility] Is this a Tele or Face-to-Face Assessment?: Tele Assessment Is this an Initial Assessment or a Re-assessment for this encounter?: Initial Assessment Living Arrangements: Children;Spouse/significant other;Other (Comment) (wife & 5 sons) Can pt return to current living arrangement?: Yes Admission Status: Voluntary Is patient capable of signing voluntary admission?: Yes Transfer from: Home Referral Source: Self/Family/Friend     [facility] Living Arrangements: Children;Spouse/significant other;Other (Comment) (wife & 5 sons) Name of Psychiatrist: none Name of Therapist: none  Education Status Is patient currently in school?: No  Risk to self Suicidal Ideation: No Suicidal Intent: No Is patient at risk for  suicide?: No Suicidal Plan?: No Access to Means: No What has been your use of drugs/alcohol within the last 12 months?: daily alcohol use Previous Attempts/Gestures: No How many times?: 0 Other Self Harm Risks: none Triggers for Past Attempts: None known Intentional Self Injurious Behavior: None Family Suicide History: No Persecutory voices/beliefs?:  No Depression: No Substance abuse history and/or treatment for substance abuse?: No Suicide prevention information given to non-admitted patients: Not applicable  Risk to Others Homicidal Ideation: No Thoughts of Harm to Others: No Current Homicidal Intent: No Current Homicidal Plan: No Access to Homicidal Means: No Identified Victim: none History of harm to others?: No Assessment of Violence: None Noted Violent Behavior Description: na Does patient have access to weapons?: No Criminal Charges Pending?: No Does patient have a court date: No  Psychosis Hallucinations: None noted Delusions: None noted  Mental Status Report Speech: Logical/coherent Level of Consciousness: Alert Mood: Other (Comment) (euthymic) Anxiety Level: None Thought Processes: Coherent;Relevant Judgement: Unimpaired Orientation: Place;Time;Person;Situation Obsessive Compulsive Thoughts/Behaviors: None  Cognitive Functioning Concentration: Normal Memory: Recent Intact;Remote Intact IQ: Average Insight: Fair Impulse Control: Poor Appetite: Fair Sleep: No Change Total Hours of Sleep: 7 Vegetative Symptoms: None  ADLScreening [facility] Assessment Services) Patient's cognitive ability adequate to safely complete daily activities?: Yes Patient able to express need for assistance with ADLs?: Yes Independently performs ADLs?: Yes (appropriate for developmental age)  Prior Inpatient Therapy Prior Inpatient Therapy: No Prior Therapy Dates: na Prior Therapy Facilty/Provider(s): na Reason for Treatment: na  Prior Outpatient Therapy Prior Outpatient Therapy: No Prior Therapy Dates: na Prior Therapy Facilty/Provider(s): na Reason for Treatment: na  ADL Screening (condition at time of admission) Patient's cognitive ability adequate to safely complete daily activities?: Yes Is the patient deaf or have difficulty hearing?: No Does the patient have difficulty seeing, even when wearing glasses/contacts?:  No Does the patient have difficulty concentrating, remembering, or making decisions?: No Patient able to express need for assistance with ADLs?: Yes Does the patient have difficulty dressing or bathing?: No Independently performs ADLs?: Yes (appropriate for developmental age) Does the patient have difficulty walking or climbing stairs?: No Weakness of Legs: None Weakness of Arms/Hands: None       Abuse/Neglect Assessment (Assessment to be complete while patient is alone) Physical Abuse: Denies Verbal Abuse: Denies Sexual Abuse: Denies Exploitation of patient/patient's resources: Denies Self-Neglect: Denies Values / Beliefs Cultural Requests During Hospitalization: None Spiritual Requests During Hospitalization: None   Advance Directives (For Healthcare) Advance Directive: Patient does not have advance directive    Additional Information 1:1 In Past 12 Months?: No CIRT Risk: No Elopement Risk: No Does patient have medical clearance?: Yes     Disposition:  Disposition Initial Assessment Completed for this Encounter: Yes Disposition of Patient: Other dispositions Other disposition(s): Other (Comment) (when pt medically cleared, will be d/c to 30 day program)  Thornell SartoriusCaroline P Shamus Desantis 12/24/2013 6:11 PM

## 2013-12-28 ENCOUNTER — Encounter (HOSPITAL_COMMUNITY): Payer: Self-pay | Admitting: Emergency Medicine

## 2013-12-28 ENCOUNTER — Emergency Department (HOSPITAL_COMMUNITY)
Admission: EM | Admit: 2013-12-28 | Discharge: 2013-12-28 | Disposition: A | Discharge status: Home / Self Care | Payer: BC Managed Care – PPO | Attending: Emergency Medicine | Admitting: Emergency Medicine

## 2013-12-28 DIAGNOSIS — F1010 Alcohol abuse, uncomplicated: Secondary | ICD-10-CM | POA: Insufficient documentation

## 2013-12-28 DIAGNOSIS — Z79899 Other long term (current) drug therapy: Secondary | ICD-10-CM | POA: Insufficient documentation

## 2013-12-28 DIAGNOSIS — F17200 Nicotine dependence, unspecified, uncomplicated: Secondary | ICD-10-CM | POA: Insufficient documentation

## 2013-12-28 LAB — COMPREHENSIVE METABOLIC PANEL
ALK PHOS: 227 U/L — AB (ref 39–117)
ALT: 45 U/L (ref 0–53)
AST: 83 U/L — ABNORMAL HIGH (ref 0–37)
Albumin: 2.9 g/dL — ABNORMAL LOW (ref 3.5–5.2)
BILIRUBIN TOTAL: 0.9 mg/dL (ref 0.3–1.2)
BUN: 12 mg/dL (ref 6–23)
CHLORIDE: 98 meq/L (ref 96–112)
CO2: 24 meq/L (ref 19–32)
Calcium: 8.5 mg/dL (ref 8.4–10.5)
Creatinine, Ser: 0.62 mg/dL (ref 0.50–1.35)
GFR calc Af Amer: >90 mL/min (ref >90)
Glucose, Bld: 126 mg/dL — ABNORMAL HIGH (ref 70–99)
Potassium: 3.8 mEq/L (ref 3.7–5.3)
SODIUM: 135 meq/L — AB (ref 137–147)
Total Protein: 7.7 g/dL (ref 6.0–8.3)

## 2013-12-28 LAB — RAPID URINE DRUG SCREEN, HOSP PERFORMED
AMPHETAMINES: NOT DETECTED
BARBITURATES: NOT DETECTED
Benzodiazepines: NOT DETECTED
Cocaine: NOT DETECTED
Opiates: NOT DETECTED
TETRAHYDROCANNABINOL: NOT DETECTED

## 2013-12-28 LAB — CBC
HCT: 34.1 % — ABNORMAL LOW (ref 39.0–52.0)
HEMOGLOBIN: 11.1 g/dL — AB (ref 13.0–17.0)
MCH: 29.6 pg (ref 26.0–34.0)
MCHC: 32.6 g/dL (ref 30.0–36.0)
MCV: 90.9 fL (ref 78.0–100.0)
Platelets: 81 10*3/uL — ABNORMAL LOW (ref 150–400)
RBC: 3.75 MIL/uL — AB (ref 4.22–5.81)
RDW: 16.2 % — ABNORMAL HIGH (ref 11.5–15.5)
WBC: 5.4 10*3/uL (ref 4.0–10.5)

## 2013-12-28 LAB — ACETAMINOPHEN LEVEL: Acetaminophen (Tylenol), Serum: <15 ug/mL (ref 10–30)

## 2013-12-28 LAB — ETHANOL: Alcohol, Ethyl (B): <11 mg/dL (ref 0–11)

## 2013-12-28 LAB — SALICYLATE LEVEL: Salicylate Lvl: <2 mg/dL — ABNORMAL LOW (ref 2.8–20.0)

## 2013-12-28 NOTE — Discharge Instructions (Signed)
Intoxicación alcohólica  (Alcohol Intoxication)  La intoxicación alcohólica ocurre cuando ha bebido la cantidad de alcohol suficiente para afectar su desenvolvimiento. Puede ser leve o muy grave. Beber gran cantidad de alcohol en un corto plazo se denomina borrachera. Puede ser muy nociva. Beber alcohol también puede ser muy peligroso si toma medicamentos o utiliza otras drogas. Algunos de los efectos causados por el alcohol son:  · Pérdida de la coordinación.  · Cambios en el estado de ánimo y la conducta.  · Pensamiento confuso.  · Dificultad para hablar (arrastrar las palabras).  · Devolver la comida (vomitar).  · Confusión.  · Disminución de la frecuencia respiratoria.  · Sacudidas y temblores (convulsiones).  · Pérdida de la conciencia.  CUIDADOS EN EL HOGAR  · No conduzca vehículos después de beber alcohol.  · Beba gran cantidad de líquido para mantener el pis (orina) de tono claro o de color amarillo pálido. Evite la cafeína.  · Sólo tome los medicamentos que le haya indicado su médico.  SOLICITE AYUDA SI:  · Devuelve (vomita) repetidas veces.  · No mejora luego de algunos días.  · Se intoxica con alcohol con frecuencia. El médico podrá ayudarlo a decidir si debe consultar a un terapeuta especializado en el abuso de sustancias.  SOLICITE AYUDA DE INMEDIATO SI:  · Siente temblores cuando deja de beber.  · Tiene temblores o sacudidas.  · Vomita sangre. Puede ser de color rojo brillante o similar a la borra del café.  · Nota sangre en las heces (movimiento intestinal).  · Se siente mareado o se desvanece (se desmaya).  ASEGÚRESE DE QUE:   · Comprende estas instrucciones.  · Controlará su afección.  · Recibirá ayuda de inmediato si no mejora o si empeora.  Document Released: 09/15/2010 Document Revised: 04/15/2013  ExitCare® Patient Information ©2014 [employer].

## 2013-12-28 NOTE — ED Notes (Addendum)
Pt here for medical clearance. Pt has already been accepted at a 30 day detox center in [place]. He is expected to arrive tomorrow. Pt not here for detox. Pt hasn't had a drink since last Wednesday. Pt denies SI or HI.

## 2013-12-28 NOTE — ED Notes (Signed)
Pt comfortable with discharge and follow up instructions. No prescriptions. Pt given paper stating is medically cleared by Dr. Fredderick PhenixBelfi.

## 2013-12-28 NOTE — ED Provider Notes (Signed)
CSN: [id number]     Arrival date & time 12/28/13  1738 History   First MD Initiated Contact with Patient 12/28/13 2149     Chief Complaint  Patient presents with  . Medical Clearance     (Consider location/radiation/quality/duration/timing/severity/associated sxs/prior Treatment) HPI Comments: Patient presents for medical clearance. He has a history of alcohol abuse and has been accepted to a three-day inpatient treatment program however they request medical clearance. He's supposed to enter the program tomorrow. He currently denies any physical complaints. He denies any recent illnesses. He denies any chest pain or shortness of breath. He has no nausea vomiting or abdominal pain. He states his last alcohol ingestion was 5 days ago. History is obtained through a Spanish interpreter at bedside.   History reviewed. No pertinent past medical history. History reviewed. No pertinent past surgical history. History reviewed. No pertinent family history. History  Substance Use Topics  . Smoking status: Current Every Day Smoker  . Smokeless tobacco: Not on file  . Alcohol Use: Yes    Review of Systems  Constitutional: Negative for fever, chills, diaphoresis and fatigue.  HENT: Negative for congestion, rhinorrhea and sneezing.   Eyes: Negative.   Respiratory: Negative for cough, chest tightness and shortness of breath.   Cardiovascular: Negative for chest pain and leg swelling.  Gastrointestinal: Negative for nausea, vomiting, abdominal pain, diarrhea and blood in stool.  Genitourinary: Negative for frequency, hematuria, flank pain and difficulty urinating.  Musculoskeletal: Negative for arthralgias and back pain.  Skin: Negative for rash.  Neurological: Negative for dizziness, speech difficulty, weakness, numbness and headaches.      Allergies  Review of patient's allergies indicates no known allergies.  Home Medications   Prior to Admission medications   Medication Sig Start Date  End Date Taking? Authorizing Provider  ondansetron (ZOFRAN ODT) 4 MG disintegrating tablet 4mg  ODT q4 hours prn nausea/vomit 12/24/13  Yes Robyn M Albert, PA-C  pantoprazole (PROTONIX) 20 MG tablet Take 1 tablet (20 mg total) by mouth daily. 12/24/13  Yes Robyn M Albert, PA-C   BP 115/76  Pulse 54  Temp(Src) 98.3 F (36.8 C)  Resp 16  Wt 127 lb 2 oz (57.664 kg)  SpO2 100% Physical Exam  Constitutional: He is oriented to person, place, and time. He appears well-developed and well-nourished.  HENT:  Head: Normocephalic and atraumatic.  Eyes: Pupils are equal, round, and reactive to light.  Neck: Normal range of motion. Neck supple.  Cardiovascular: Normal rate, regular rhythm and normal heart sounds.   Pulmonary/Chest: Effort normal and breath sounds normal. No respiratory distress. He has no wheezes. He has no rales. He exhibits no tenderness.  Abdominal: Soft. Bowel sounds are normal. There is no tenderness. There is no rebound and no guarding.  Musculoskeletal: Normal range of motion. He exhibits no edema.  Lymphadenopathy:    He has no cervical adenopathy.  Neurological: He is alert and oriented to person, place, and time.  Skin: Skin is warm and dry. No rash noted.  Psychiatric: He has a normal mood and affect.    ED Course  Procedures (including critical care time) Labs Review Results for orders placed during the hospital encounter of 12/28/13  ACETAMINOPHEN LEVEL      Result Value Ref Range   Acetaminophen (Tylenol), Serum <15.0  10 - 30 ug/mL  CBC      Result Value Ref Range   WBC 5.4  4.0 - 10.5 K/uL   RBC 3.75 (*) 4.22 - 5.81 MIL/uL  Hemoglobin 11.1 (*) 13.0 - 17.0 g/dL   HCT 16.1 (*) 09.6 - 04.5 %   MCV 90.9  78.0 - 100.0 fL   MCH 29.6  26.0 - 34.0 pg   MCHC 32.6  30.0 - 36.0 g/dL   RDW 40.9 (*) 81.1 - 91.4 %   Platelets 81 (*) 150 - 400 K/uL  COMPREHENSIVE METABOLIC PANEL      Result Value Ref Range   Sodium 135 (*) 137 - 147 mEq/L   Potassium 3.8  3.7 - 5.3  mEq/L   Chloride 98  96 - 112 mEq/L   CO2 24  19 - 32 mEq/L   Glucose, Bld 126 (*) 70 - 99 mg/dL   BUN 12  6 - 23 mg/dL   Creatinine, Ser 7.82  0.50 - 1.35 mg/dL   Calcium 8.5  8.4 - 95.6 mg/dL   Total Protein 7.7  6.0 - 8.3 g/dL   Albumin 2.9 (*) 3.5 - 5.2 g/dL   AST 83 (*) 0 - 37 U/L   ALT 45  0 - 53 U/L   Alkaline Phosphatase 227 (*) 39 - 117 U/L   Total Bilirubin 0.9  0.3 - 1.2 mg/dL   GFR calc non Af Amer >90  >90 mL/min   GFR calc Af Amer >90  >90 mL/min  ETHANOL      Result Value Ref Range   Alcohol, Ethyl (B) <11  0 - 11 mg/dL  SALICYLATE LEVEL      Result Value Ref Range   Salicylate Lvl <2.0 (*) 2.8 - 20.0 mg/dL  URINE RAPID DRUG SCREEN (HOSP PERFORMED)      Result Value Ref Range   Opiates NONE DETECTED  NONE DETECTED   Cocaine NONE DETECTED  NONE DETECTED   Benzodiazepines NONE DETECTED  NONE DETECTED   Amphetamines NONE DETECTED  NONE DETECTED   Tetrahydrocannabinol NONE DETECTED  NONE DETECTED   Barbiturates NONE DETECTED  NONE DETECTED   Dg Chest 2 View  12/24/2013   CLINICAL DATA:  Chest pain  EXAM: CHEST  2 VIEW  COMPARISON:  None.  FINDINGS: The heart size and mediastinal contours are within normal limits. Both lungs are clear. The visualized skeletal structures are unremarkable.  IMPRESSION: No active cardiopulmonary disease.   Electronically Signed   By: Elige Ko   On: 12/24/2013 12:35   US Abdomen Complete  12/24/2013   CLINICAL DATA:  Atypical chest pain.  Epigastric pain.  EXAM: ULTRASOUND ABDOMEN COMPLETE  COMPARISON:  None.  FINDINGS: Gallbladder:  No gallstones or wall thickening visualized. No sonographic Murphy sign noted.  Common bile duct:  Diameter: 4 mm  Liver:  No focal lesion identified. Within normal limits in parenchymal echogenicity.  IVC:  No abnormality visualized.  Pancreas:  Visualized portion unremarkable.  Spleen:  Size and appearance within normal limits.  Right Kidney:  Length: 11.7 cm. Echogenicity within normal limits. No mass or  hydronephrosis visualized.  Left Kidney:  Length: 11.0 cm. Echogenicity within normal limits. No mass or hydronephrosis visualized.  Abdominal aorta:  No aneurysm visualized.  Other findings:  None.  IMPRESSION: Negative. No evidence of gallstones, hydronephrosis, or other significant abnormality.   Electronically Signed   By: Myles Rosenthal M.D.   On: 12/24/2013 15:57      EKG Interpretation None        Imaging Review No results found.   EKG Interpretation None      MDM   Final diagnoses:  Alcohol abuse  Patient's labs are reviewed and not significantly changed from his last values. He did not appear to have any acute medical issues that would prevent him from going through alcohol treatment program.    Rolan BuccoMelanie Taksh Hjort, MD 12/29/13 0000

## 2014-02-03 ENCOUNTER — Ambulatory Visit: Payer: BC Managed Care – PPO | Admitting: Internal Medicine

## 2014-04-12 ENCOUNTER — Ambulatory Visit: Payer: BC Managed Care – PPO | Admitting: Internal Medicine

## 2014-04-19 ENCOUNTER — Ambulatory Visit: Payer: BC Managed Care – PPO | Admitting: Family Medicine

## 2015-02-08 ENCOUNTER — Encounter (HOSPITAL_COMMUNITY): Payer: Self-pay | Admitting: *Deleted

## 2015-02-08 ENCOUNTER — Emergency Department (HOSPITAL_COMMUNITY)
Admission: EM | Admit: 2015-02-08 | Discharge: 2015-02-09 | Disposition: A | Discharge status: Home / Self Care | Payer: BLUE CROSS/BLUE SHIELD | Attending: Emergency Medicine | Admitting: Emergency Medicine

## 2015-02-08 DIAGNOSIS — Y92322 Soccer field as the place of occurrence of the external cause: Secondary | ICD-10-CM | POA: Diagnosis not present

## 2015-02-08 DIAGNOSIS — S0591XA Unspecified injury of right eye and orbit, initial encounter: Secondary | ICD-10-CM | POA: Diagnosis not present

## 2015-02-08 DIAGNOSIS — Y9366 Activity, soccer: Secondary | ICD-10-CM | POA: Diagnosis not present

## 2015-02-08 DIAGNOSIS — W2102XA Struck by soccer ball, initial encounter: Secondary | ICD-10-CM | POA: Insufficient documentation

## 2015-02-08 DIAGNOSIS — H2101 Hyphema, right eye: Secondary | ICD-10-CM | POA: Insufficient documentation

## 2015-02-08 DIAGNOSIS — Y998 Other external cause status: Secondary | ICD-10-CM | POA: Insufficient documentation

## 2015-02-08 DIAGNOSIS — Z720 Tobacco use: Secondary | ICD-10-CM | POA: Diagnosis not present

## 2015-02-08 DIAGNOSIS — S0590XA Unspecified injury of unspecified eye and orbit, initial encounter: Secondary | ICD-10-CM

## 2015-02-08 MED ORDER — FLUORESCEIN SODIUM 1 MG OP STRP
1.0000 | ORAL_STRIP | Freq: Once | OPHTHALMIC | Status: AC
  Administered 2015-02-08: 1 via OPHTHALMIC
  Filled 2015-02-08: qty 1

## 2015-02-08 MED ORDER — TETRACAINE HCL 0.5 % OP SOLN
2.0000 [drp] | Freq: Once | OPHTHALMIC | Status: AC
  Administered 2015-02-08: 2 [drp] via OPHTHALMIC
  Filled 2015-02-08: qty 2

## 2015-02-08 NOTE — ED Provider Notes (Signed)
CSN: [id number]     Arrival date & time 02/08/15  2037 History  This chart was scribed for non-physician practitioner, Jinny Sanders, PA-C working with Richardean Canal, MD by Placido Sou, ED scribe. This patient was seen in room TR09C/TR09C and the patient's care was started at 9:59 PM.    Chief Complaint  Patient presents with  . Eye Injury    The history is provided by the patient. No language interpreter was used.    HPI Comments: Donald Briggs is a 39 y.o. male who presents to the Emergency Department complaining of constant, moderate, pain and irritation to his right eye with onset 3 hours ago. Pt notes playing soccer and was struck in the eye with a ball. He notes blurred vision as an associated symptom. Pt denies LOC, HA, nausea, and vomiting.   History reviewed. No pertinent past medical history. History reviewed. No pertinent past surgical history. History reviewed. No pertinent family history. History  Substance Use Topics  . Smoking status: Current Every Day Smoker  . Smokeless tobacco: Not on file  . Alcohol Use: Yes    Review of Systems  Eyes: Positive for pain and visual disturbance.  Gastrointestinal: Negative for nausea and vomiting.  Neurological: Negative for syncope and headaches.      Allergies  Review of patient's allergies indicates no known allergies.  Home Medications   Prior to Admission medications   Medication Sig Start Date End Date Taking? Authorizing Provider  ondansetron (ZOFRAN ODT) 4 MG disintegrating tablet  ODT q4 hours prn nausea/vomit 12/24/13   Robyn M Hess, PA-C  pantoprazole (PROTONIX) 20 MG tablet Take 1 tablet (20 mg total) by mouth daily. 12/24/13   Robyn M Hess, PA-C   BP 111/68 mmHg  Pulse 46  Temp(Src) 97.8 F (36.6 C) (Oral)  Resp 16  Wt 138 lb 2 oz (62.653 kg)  SpO2 100% Physical Exam  Constitutional: He is oriented to person, place, and time. He appears well-developed and well-nourished. No distress.  HENT:  Head:  Normocephalic and atraumatic.  Mouth/Throat: Oropharynx is clear and moist.  Eyes: EOM are normal. Pupils are equal, round, and reactive to light. Right conjunctiva is injected. Right eye exhibits normal extraocular motion and no nystagmus. Left eye exhibits normal extraocular motion and no nystagmus. Right pupil is round and reactive. Pupils are equal.  Fundoscopic exam:      The right eye shows no red reflex.  Slit lamp exam:      The right eye shows hyphema. The right eye shows no corneal abrasion, no corneal flare, no corneal ulcer, no foreign body, no hypopyon, no fluorescein uptake and no anterior chamber bulge.  Neck: Normal range of motion. Neck supple. No tracheal deviation present.  Cardiovascular: Normal rate.   Pulmonary/Chest: Breath sounds normal. No respiratory distress.  Abdominal: Soft.  Musculoskeletal: Normal range of motion.  Neurological: He is alert and oriented to person, place, and time.  Skin: Skin is warm and dry.  Psychiatric: He has a normal mood and affect. His behavior is normal.  Nursing note and vitals reviewed.   ED Course  [place] bedside Date/Time: 02/11/2015 12:19 AM Performed by: Ladona Mow Authorized by: Ladona Mow Consent: Verbal consent obtained. Risks and benefits: risks, benefits and alternatives were discussed Consent given by: patient Patient identity confirmed: verbally with patient Time out: Immediately prior to procedure a "time out" was called to verify the correct patient, procedure, equipment, support staff and site/side marked as required. Preparation: Patient was prepped  and draped in the usual sterile fashion. Local anesthesia used: no Patient sedated: no Patient tolerance: Patient tolerated the procedure well with no immediate complications Comments: [place] R eye.      DIAGNOSTIC STUDIES: Oxygen Saturation is 97% on RA, normal by my interpretation.    COORDINATION OF CARE: 10:04 PM Discussed treatment plan with pt at bedside and pt  agreed to plan.  Labs Review Labs Reviewed - No data to display  Imaging Review Ct Head Wo Contrast  02/09/2015   CLINICAL DATA:  Redness and swelling of the right eye. Blurred vision. Struck with a soccer ball.  EXAM: CT HEAD WITHOUT CONTRAST  TECHNIQUE: Contiguous axial images were obtained from the base of the skull through the vertex without intravenous contrast.  COMPARISON:  None.  FINDINGS: Intracranial contents are symmetrical. No mass effect or midline shift. No abnormal extra-axial fluid collections. Gray-white matter junctions are distinct. Basal cisterns are not effaced. No evidence of acute intracranial hemorrhage. No depressed skull fractures. Visualized paranasal sinuses and mastoid air cells are not opacified.  IMPRESSION: No acute intracranial abnormalities.   Electronically Signed   By: Burman Nieves M.D.   On: 02/09/2015 01:03     EKG Interpretation None      MDM   Final diagnoses:  Eye injury    Patient seen and evaluated for traumatic injury to R eye from being hit by soccer ball. Visual acuity 20/100 in R eye. No foreign bodies, corneal abrasions noted on exam. No, vision loss, concern for orbital or preseptal cellulitis. No iritis or chemosis. Pt does have noticeable hyphema initially on exam.  Intraocular pressures 22. . No concern for glaucoma, no signs or symptoms of retinal detachment or floaters. [place] of eye showed normal anatomy without evidence of retinal detachment.  No concern for orbital entrapment on exam.  After [place], hyphema was noted to resolve completely, and pt states vision actually improving.  F/u with CT head.    CT with impression: No acute intracranial abnormalities.  Pt stable for discharge at this time.  Pt strongly encouraged to f/u with Ophthalmology tomorrow.  Return precautions discussed, pt verbalizes understanding and agreement of this plan.   I personally performed the services described in this documentation, which was scribed in my  presence. The recorded information has been reviewed and is accurate.  BP 111/68 mmHg  Pulse 46  Temp(Src) 97.8 F (36.6 C) (Oral)  Resp 16  Wt 138 lb 2 oz (62.653 kg)  SpO2 100%  Signed,  Ladona Mow, PA-C 12:20 AM  Patient seen and discussed with Dr. Chaney Malling, MD  Ladona Mow, PA-C 02/11/15 0020  Richardean Canal, MD 02/11/15 [phone]

## 2015-02-08 NOTE — ED Notes (Signed)
Pt in with family stating he was playing soccer and was hit in his right eye with the ball, redness and swelling noted, pt reports blurred vision, no distress noted

## 2015-02-09 ENCOUNTER — Emergency Department (HOSPITAL_COMMUNITY): Payer: BLUE CROSS/BLUE SHIELD

## 2015-02-09 NOTE — Discharge Instructions (Signed)
Hyphema  A hyphema is bleeding in the front chamber of the eye, inside the eye itself, between the cornea (clear outer covering of the eye) and the iris (colored part of the eye). It may occur from any form of injury to the eye. It may also occur on its own (spontaneously) in certain medical conditions.  Because of gravity, the blood generally settles to the lower part of the eye. This creates a clear red area, with a "fluid level" or straight top, which is clearly visible when looking at the eye. Very small hyphemas may only be visible to an eye specialist, when doing an examination with special tools. Very large hyphemas may completely fill the front chamber, so that the colored part of the eye cannot be seen at all. This is often called an "8 Ball" or "Grade 4" hyphema. It is a dangerous and often painful condition, that must be treated immediately.  CAUSES   The most common cause of a hyphema is injury (trauma). In some cases, even a very mild blow to the eye can result in a hyphema. Any condition that makes a patient more likely to bleed can also cause a hyphema. Examples include:   Diseases that prevent normal blood clotting (hemophilia, blood disorders, low platelet count).   Use of anti-coagulant drugs or blood thinners (Heparin, Coumadin).   Overuse of aspirin, or similar medicines.   Diabetes.   Recent eye surgery.  SYMPTOMS    A very small (microscopic) hyphema may cause no symptoms at all, or it may cause slightly blurred vision in the affected eye.   A hyphema with a fluid level usually causes blurred vision in the affected eye.   An "8 Ball" hyphema causes severe or total loss of vision in the affected eye, and may be very painful.  RISKS AND COMPLICATIONS   The fluid normally present in the front part of the eye is constantly produced and drained from the inside of the eye, by an internal drainage system. When a hyphema occurs, the blood clogs up this drainage system. This may cause a  build-up of fluid, resulting in increased pressure inside the eye. This condition is a form of glaucoma (eye disease that involves pressure inside the eyeball).   "8 Ball" hyphemas may clog up the drainage system completely, causing a sharp (acute) and sudden rise in the pressure inside the eye. This is a dangerous and painful condition. "8 Ball" hyphemas must be treated as soon as possible. The longer they are present, the greater the danger of permanent damage and vision loss.   It is important to know that every hyphema has the potential to "re-bleed" and become an "8 Ball" hyphema. The greatest danger of this happening is between one and two weeks after the hyphema first occurred.   Chronic, recurring hyphemas can cause scarring inside the eye, which may cause further complications.  TREATMENT   Most hyphemas that are not "8 Ball" hyphemas clear up fully on their own. Depending on the amount of blood, it may take several days to a few weeks to clear, with a gradual return of normal vision.   The treatment for these types of hyphemas include:   Restricted activity or bed rest.   Stopping all medicines that can increase bleeding (aspirin, blood thinners).   Drops to enlarge (dilate) the pupil of the injured eye (to prevent internal scarring).   Close monitoring by your eye specialist, until the hyphema has completely cleared. This is to   Surgery to remove the blood from the front part of the eye.  Medicine (drops or pills) to control the pressure in the eye.  A small opening may be made in the iris (colored part) of the eye, to make sure the blood can drain out and that pressure in the eye does not become dangerously high. HOME CARE INSTRUCTIONS   Follow your caregiver's instructions, otherwise more bleeding may occur. This could result in  permanent loss of vision.  Rest in bed as much as possible, for as long as directed by your caregiver. Lie on your back, and use extra pillows to keep your head raised. You may go the bathroom, eat, and bathe while you are up.  Only take over-the-counter or prescription medicines for pain, discomfort, or fever as directed by your caregiver.  If you have an eye shield, remove it only to put in your prescribed eye drops, and then replace it over your eye. Do this for as long as directed by your caregiver. This is to help protect your eye from further injury and a possible re-bleed.  Do not bend forward or lower your head until the hyphema clears up. Do not do lifting or strenuous activities until the hyphema completely clears up, or as directed by your caregiver. SEEK IMMEDIATE MEDICAL CARE IF:   Your vision changes in any way, or you develop pain in the affected eye.  You are unable to see the colored part of your eye, when you could see all or part of it before.  You feel sick to your stomach (nauseous) or start to vomit. MAKE SURE YOU:   Understand these instructions.  Will watch your condition.  Will get help right away if you are not doing well or get worse. Always wear eye protection when involved in any sports or work-related activities that could result in an injury to your eyes. Document Released: 11/19/2000 Document Revised: 11/05/2011 Document Reviewed: 06/23/2009 [facility] Patient Information 2015 [place], [place]. This information is not intended to replace advice given to you by your health care provider. Make sure you discuss any questions you have with your health care provider.   Emergency Department Resource Guide 1) Find a Doctor and Pay Out of Pocket Although you won't have to find out who is covered by your insurance plan, it is a good idea to ask around and get recommendations. You will then need to call the office and see if the doctor you have chosen will accept you  as a new patient and what types of options they offer for patients who are self-pay. Some doctors offer discounts or will set up payment plans for their patients who do not have insurance, but you will need to ask so you aren't surprised when you get to your appointment.  2) Contact Your Local Health Department Not all health departments have doctors that can see patients for sick visits, but many do, so it is worth a call to see if yours does. If you don't know where your local health department is, you can check in your phone book. The CDC also has a tool to help you locate your state's health department, and many state websites also have listings of all of their local health departments.  3) Find a Walk-in Clinic If your illness is not likely to be very severe or complicated, you may want to try a walk in clinic. These are popping up all over the country in pharmacies, drugstores, and shopping centers. They're usually  staffed by nurse practitioners or physician assistants that have been trained to treat common illnesses and complaints. They're usually fairly quick and inexpensive. However, if you have serious medical issues or chronic medical problems, these are probably not your best option.  No Primary Care Doctor: - Call Health Connect at  [phone] - they can help you locate a primary care doctor that  accepts your insurance, provides certain services, etc. - Physician Referral Service- [phone]  Chronic Pain Problems: Organization         Address  Phone   Notes  [facility]  [phone] Patients need to be referred by their primary care doctor.   Medication Assistance: Organization         Address  Phone   Notes  [facility] [address] [place], [place] [address] [phone] --Must be a resident of [facility] -- Must have NO insurance coverage whatsoever (no Medicaid/ Medicare, etc.) -- The pt. MUST have  a primary care doctor that directs their care regularly and follows them in the community   [facility]  [phone]   [employer]  [phone]    Agencies that provide inexpensive medical care: Organization         Address  Phone   Notes  [facility]  [phone]   [facility]    [phone]   [facility] [address] [place], [place] [address] [phone]   [facility] [address] [place] [phone]   [facility]anned Parenthood    [phone]   [facility]    [phone]   [facility]  [address], [place] Phone:  [phone], Fax:  [phone] Hours of Operation:  9 am - 6 pm, M-F.  Also accepts Medicaid/Medicare and self-pay.  [facility]  [address], [place] Phone: [phone], Fax: [phone]. Hours of Operation:  8:30 am - 5:30 pm, M-F.  Also accepts Medicaid and self-pay.  [facility] [place] [address] [place] Phone: [phone]   [facility] [address] Natasha Bence [place], [place] [phone], Ext. [phone] Mondays & Thursdays: 7-9 AM.  First 15 patients are seen on a first come, first serve basis.    Medicaid-accepting [facility] Providers:  Organization         Address  Phone   Notes  [facility] [address], [place] [phone] Also accepts self-pay patients.  [facility] [address] Laurell Josephs [place], [place]  [phone]   [facility] [address], [place] [phone]   [facility] [address], [place] [phone]   Renaye Rakers [address], [place]   [phone] Only accepts [place] Access [place] patients after they have their name applied to their card.   Self-Pay (no insurance) in [facility]:  Organization         Address  Phone   Notes  Sickle Cell Patients, [facility] [address] [place], [place] [phone]   [facility] [address] [place], [place] [phone]   [facility] [place]  [address],  [phone]   [facility]/Dr. Osei-Bonsu  [address] [place] [address], [place] or [address], [place] [phone] Phone number for both [place] and [place] locations is the same.  [facility] [address], [place] [phone]   [facility] [address] [place] or [address] [phone]   [facility] [address] [place] [phone], phone; [phone], fax Sees patients 1st and 3rd Saturday of every month.  Must not qualify for public or private insurance (i.e. Medicaid, Medicare, [place] Health Choice, Veterans' Benefits)  Household income should be no more than 200% of the poverty level The clinic cannot treat you if you are pregnant or think you are pregnant  Sexually transmitted diseases are not treated at the clinic.    Dental Care: Organization         Address  Phone  Notes  [facility] Department of [facility] [address] [place], [place] [phone] Accepts children up to age 49 who are enrolled in [place] or [place] Health Choice; pregnant women with a Medicaid card; and children who have applied for Medicaid or [place] Health Choice, but were declined, whose parents can pay a reduced fee at time of service.  [facility] Department of [facility]  [address], [place] [phone] Accepts children up to age 26 who are enrolled in [place] or [place] Health Choice; pregnant women with a Medicaid card; and children who have applied for Medicaid or [place] Health Choice, but were declined, whose parents can  pay a reduced fee at time of service.  [facility]  [address] [place], [place] [phone] Patients are seen by appointment only. Walk-ins are not accepted. [facility] will see patients 25 years of age and older. Monday - Tuesday (8am-5pm) Most Wednesdays (8:30-5pm) $30 per visit, cash only  [facility] Adult Dental Access PRO[facility]  [address], [facility] [phone] Patients are seen by appointment only. Walk-ins are not accepted. [facility] will see patients 17 years of age and older. One Wednesday Evening (Monthly: Volunteer Based).  $30 per visit, cash only  [employer]  [phone] for adults; Children under age 30, call Graduate Pediatric Dentistry at [phone]. Children aged 3-14, please call [phone] to request a pediatric application.  Dental services are provided in all areas of dental care including fillings, crowns and bridges, complete and partial dentures, implants, gum treatment, root canals, and extractions. Preventive care is also provided. Treatment is provided to both adults and children. Patients are selected via a lottery and there is often a waiting list.   [facility] [address] [place]  [phone] [email or web address]   [facility] [address] [place], [place] [phone], Ext. [phone] Second and Fourth Thursday of each month, opens at 6:30 AM; Clinic ends at 9 AM.  Patients are seen on a first-come first-served basis, and a limited number are seen during each clinic.   [facility]  [address] Ether Griffins [place], [place] [phone]   Eligibility Requirements You must have lived in [place], [place], or [place] counties for at least the last three months.   You cannot be eligible for state or federal sponsored [place], including [employer], [place], or [employer].   You generally cannot be eligible for healthcare  insurance through your  employer.    How to apply: Eligibility screenings are held every Tuesday and Wednesday afternoon from 1:00 pm until 4:00 pm. You do not need an appointment for the interview!  [facility] [address], [place], [place] [phone]   [facility] Health Department  [phone]   [facility] Health Department  [phone]   [facility] Health Department  [phone]    Behavioral Health Resources in the Community: Intensive Outpatient Programs Organization         Address  Phone  Notes  [facility] [address] [place], [place] [phone]   [facility] Outpatient [address] [place], [place] [phone]   [facility] [address] [place], [place]  [phone]   [facility] [address]  [place], [place] [phone] or [phone]   Substance Abuse Resources Organization         Address  Phone  Notes  [facility]  [phone]   [facility]  [phone]   [facility] [place]  [phone]   Floydene Flock  [phone]   Residential & Outpatient Substance Abuse Program  [phone]   Psychological Services Organization         Address  Phone  Notes  [facility] Health  [phone]   [facility]  [phone]   [facility] [address] [place] [phone] or [phone]    Mobile Crisis Teams Organization         Address  Phone  Notes  [facility], Mobile Crisis Care Unit  [phone]   [facility]  [address] [place], [place] [phone]   Doristine Locks [address] [place] [phone]    Self-Help/Support Groups Organization         Address  Phone             Notes  [facility] [place] - variety of support groups  [phone]- I7437963 Call for more information  Narcotics Anonymous (NA),  [facility] [address], [employer] [place]  2 meetings at this location   Statistician         Address  Phone  Notes  [facility] [address],    [place]  [phone]   [facility]  [address] [place] [address], [place], [place] [phone]   [facility] Facility [address] [place], [place] [phone] Admissions: 8am-3pm M-F  [facility] [address]    [address], [place] [phone]   [facility] [address] [place], [place], [place] [phone]   [facility] [address]  [place], [place] [phone]   [facility] - Intensive Outpatient [address] Laurell Josephs [address], , [place] [phone]   [facility] ([facility] [address] [place]  [place], [place] [phone] or [phone]   [facility] ([facility]) [address], [place], [place] [phone] Accepts Medicaid  [facility] [address]  [place] [phone] Substance Abuse/Addiction Treatment   [facility] Organization         Address  Phone  Notes  [facility]  [phone]   Angie Fava, PhD [address] [place], [place]   [phone] or [phone]   [facility]   [address] [place], [place] [phone]   [facility] [address], [place], [place] [phone] Insurance/Medicaid/sponsorship through [employer] and Families [address]                                    [place], [place] [phone] Therapy/tele-psych/case  [facility] [address] [place] [address]   [place], [place] [phone]    Dr. Lolly Mustache  [phone]   [facility]  [employer] [facility] Dept. 1) [address], [place] 2) [address], [place] 3)  [address], [place] [phone]  [phone]    [facility] Child Abuse Hotline [phone] or [phone] (After Hours)

## 2016-04-30 ENCOUNTER — Ambulatory Visit (HOSPITAL_COMMUNITY)
Admission: EM | Admit: 2016-04-30 | Discharge: 2016-04-30 | Disposition: A | Discharge status: Home / Self Care | Payer: BLUE CROSS/BLUE SHIELD | Attending: Family Medicine | Admitting: Family Medicine

## 2016-04-30 ENCOUNTER — Encounter (HOSPITAL_COMMUNITY): Payer: Self-pay | Admitting: *Deleted

## 2016-04-30 DIAGNOSIS — R0789 Other chest pain: Secondary | ICD-10-CM | POA: Diagnosis not present

## 2016-04-30 DIAGNOSIS — S29011A Strain of muscle and tendon of front wall of thorax, initial encounter: Secondary | ICD-10-CM

## 2016-04-30 DIAGNOSIS — S2341XA Sprain of ribs, initial encounter: Secondary | ICD-10-CM | POA: Diagnosis not present

## 2016-04-30 DIAGNOSIS — R071 Chest pain on breathing: Secondary | ICD-10-CM

## 2016-04-30 MED ORDER — DICLOFENAC POTASSIUM 50 MG PO TABS: 50.0000 mg | ORAL_TABLET | Freq: Three times a day (TID) | ORAL | 0 refills | Status: DC

## 2016-04-30 NOTE — ED Triage Notes (Signed)
Pt  Is  A  Smoker   Drinks  A  Six  Pack  Per  Day       Pt  Reports  Pain  r     Side    Hurts  When he  Coughs  Or  Takes  Deep breath      He  Is   Sitting   Upright  On exam table  Speaking in  Complete  sentances        Cap  Refill  Is   Present    Skin is  Warm  And  Dry

## 2016-04-30 NOTE — ED Provider Notes (Signed)
CSN: [id number]     Arrival date & time 04/30/16  1912 History   First MD Initiated Contact with Patient 04/30/16 2045     Chief Complaint  Patient presents with  . Chest Pain   (Consider location/radiation/quality/duration/timing/severity/associated sxs/prior Treatment) 40 year old spending male complaining of pain to the right parathoracic area radiating laterally to the right lateral chest. This pain is elicited and exacerbated by taking a deep breath or twisting at his waist or chest. 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 He is constantly lifting objects turning at his waist and putting them in another place. This exacerbates his pain. He denies cough, shortness of breath, fever or other respiratory symptoms. Denies blunt trauma.      History reviewed. No pertinent past medical history. History reviewed. No pertinent surgical history. History reviewed. No pertinent family history. Social History  Substance Use Topics  . Smoking status: Current Every Day Smoker  . Smokeless tobacco: Not on file  . Alcohol use Yes    Review of Systems  Constitutional: Positive for activity change. Negative for fatigue and fever.  HENT: Negative.   Respiratory: Negative for cough, chest tightness, shortness of breath and wheezing.   Cardiovascular: Negative for chest pain.  Gastrointestinal: Negative.   Musculoskeletal: Positive for back pain. Negative for joint swelling and neck pain.  Skin: Negative.   Neurological: Negative.   All other systems reviewed and are negative.   Allergies  Review of patient's allergies indicates no known allergies.  Home Medications   Prior to Admission medications   Medication Sig Start Date End Date Taking? Authorizing Provider  diclofenac (CATAFLAM) 50 MG tablet Take 1 tablet (50 mg total) by mouth 3 (three) times daily. One tablet TID with food prn pain. 04/30/16   Hayden Rasmussenavid Ranell Finelli, NP  ondansetron (ZOFRAN ODT) 4 MG disintegrating tablet 4mg  ODT q4 hours  prn nausea/vomit 12/24/13   Robyn M Hess, PA-C  pantoprazole (PROTONIX) 20 MG tablet Take 1 tablet (20 mg total) by mouth daily. 12/24/13   Kathrynn Speedobyn M Hess, PA-C   Meds Ordered and Administered this Visit  Medications - No data to display  BP 95/58 (BP Location: Left Arm)   Pulse 70   Temp 98.5 F (36.9 C) (Oral)   Resp 16   SpO2 98%  No data found.   Physical Exam  Constitutional: He is oriented to person, place, and time. He appears well-developed and well-nourished. No distress.  HENT:  Head: Normocephalic and atraumatic.  Eyes: EOM are normal. Left eye exhibits no discharge.  Neck: Normal range of motion. Neck supple.  Cardiovascular: Normal rate and regular rhythm.   Pulmonary/Chest: Effort normal and breath sounds normal. No respiratory distress. He has no wheezes. He has no rales.  Patient is able to reproduce pain by taking a deep breath and rotating at the waist. Lungs are perfectly clear. No rubs, crackles, wheezing or other adventitious sounds. Good air movement. Normal inspiratory and expiratory phases. No tenderness is is elicited with palpation.   Abdominal: Soft. There is no tenderness.  Musculoskeletal: Normal range of motion. He exhibits no edema or deformity.  Neurological: He is alert and oriented to person, place, and time. No cranial nerve deficit.  Skin: Skin is warm and dry.  Psychiatric: He has a normal mood and affect.  Nursing note and vitals reviewed.   Urgent Care Course   Clinical Course    Procedures (including critical care time)  Labs Review Labs Reviewed - No data to display  Imaging Review No results found.   Visual Acuity Review  Right Eye Distance:   Left Eye Distance:   Bilateral Distance:    Right Eye Near:   Left Eye Near:    Bilateral Near:         MDM   1. Chest pain on breathing   2. Chest wall pain   3. Intercostal muscle strain, initial encounter    Ice to sore areas. Limit movement that makes it worse Take  medication for pain as needed, take with food Meds ordered this encounter  Medications  . diclofenac (CATAFLAM) 50 MG tablet    Sig: Take 1 tablet (50 mg total) by mouth 3 (three) times daily. One tablet TID with food prn pain.    Dispense:  21 tablet    Refill:  0    Order Specific Question:   Supervising Provider    Answer:   Linna Hoff [[id number]]       Hayden Rasmussen, NP 04/30/16 2051    Hayden Rasmussen, NP 04/30/16 2052

## 2016-04-30 NOTE — Discharge Instructions (Signed)
Ice to sore areas. Limit movement that makes it worse Take medication for pain as needed, take with food

## 2017-01-26 ENCOUNTER — Emergency Department (HOSPITAL_COMMUNITY)
Admission: EM | Admit: 2017-01-26 | Discharge: 2017-01-27 | Disposition: A | Discharge status: Home / Self Care | Payer: BLUE CROSS/BLUE SHIELD | Attending: Emergency Medicine | Admitting: Emergency Medicine

## 2017-01-26 ENCOUNTER — Encounter (HOSPITAL_COMMUNITY): Payer: Self-pay

## 2017-01-26 DIAGNOSIS — K089 Disorder of teeth and supporting structures, unspecified: Secondary | ICD-10-CM | POA: Insufficient documentation

## 2017-01-26 DIAGNOSIS — Z5321 Procedure and treatment not carried out due to patient leaving prior to being seen by health care provider: Secondary | ICD-10-CM | POA: Insufficient documentation

## 2017-01-26 NOTE — ED Triage Notes (Signed)
Onset 2 days right upper tooth pain.  Today pain worsened and had fever.  Painful when chewing food, sensitive to cold/hot.   Took Tylenol @ 7pm.

## 2017-01-26 NOTE — ED Notes (Signed)
Called for room x 1.  

## 2017-07-29 ENCOUNTER — Other Ambulatory Visit: Payer: Self-pay

## 2017-07-29 ENCOUNTER — Encounter (HOSPITAL_COMMUNITY): Payer: Self-pay | Admitting: Emergency Medicine

## 2017-07-29 ENCOUNTER — Emergency Department (HOSPITAL_COMMUNITY)
Admission: EM | Admit: 2017-07-29 | Discharge: 2017-07-29 | Disposition: A | Discharge status: Home / Self Care | Payer: BLUE CROSS/BLUE SHIELD | Attending: Emergency Medicine | Admitting: Emergency Medicine

## 2017-07-29 DIAGNOSIS — M545 Low back pain, unspecified: Secondary | ICD-10-CM

## 2017-07-29 DIAGNOSIS — Z79899 Other long term (current) drug therapy: Secondary | ICD-10-CM | POA: Insufficient documentation

## 2017-07-29 DIAGNOSIS — F17200 Nicotine dependence, unspecified, uncomplicated: Secondary | ICD-10-CM | POA: Insufficient documentation

## 2017-07-29 MED ORDER — METHOCARBAMOL 500 MG PO TABS: 500.0000 mg | ORAL_TABLET | Freq: Two times a day (BID) | ORAL | 0 refills | Status: DC

## 2017-07-29 MED ORDER — OXYCODONE-ACETAMINOPHEN 5-325 MG PO TABS
1.0000 | ORAL_TABLET | Freq: Once | ORAL | Status: AC
  Administered 2017-07-29: 1 via ORAL
  Filled 2017-07-29: qty 1

## 2017-07-29 MED ORDER — NAPROXEN 500 MG PO TABS: 500.0000 mg | ORAL_TABLET | Freq: Two times a day (BID) | ORAL | 0 refills | Status: DC

## 2017-07-29 NOTE — ED Notes (Signed)
ED Provider at bedside. 

## 2017-07-29 NOTE — ED Triage Notes (Signed)
Patient reports persistent right lower back pain onset last week worse with movement/changing positions , denies injury or fall , pt. stated heavy lifting at work . No hematuria or dysuria .

## 2017-07-29 NOTE — Discharge Instructions (Signed)
Back Pain:  I have prescribed you an anti-inflammatory medication and a muscle relaxer.   Naproxen is a nonsteroidal anti-inflammatory medication that will help with pain and swelling. Be sure to take this medication as prescribed with food, 1 pill every 12 hours,  It should be taken with food, as it can cause stomach upset, and more seriously, stomach bleeding. Do not take other nonsteroidal anti-inflammatory medications with this such as Advil, Motrin, or Aleve.   Robaxin is the muscle relaxer I have prescribed, this is meant to help with muscle tightness. Be aware that this medication may make you drowsy therefore the first time you take this it should be at a time you are in an environment where you can rest. Do not drive or operate heavy machinery when taking this medication.   In addition you may also take Tylenol. Tylenol is generally safe, though you should not take more than 8 of the extra strength (500mg ) pills a day.  The application of heat can help soothe the pain.  Maintaining your daily activities, including walking, is encourged, as it will help you get better faster than just staying in bed.  Low back pain is discomfort in the lower back that may be due to injuries to muscles and ligaments around the spine.  Occasionally, it may be caused by a a problem to a part of the spine called a disc.  The pain may last several days or a few weeks.   Your pain should get better over the next 2 weeks.  You will need to follow up with  Your primary healthcare provider in 1-2 weeks for reassessment, if you do not have a primary care provider one is provided in your discharge instructions. However if you develop severe or worsening pain, low back pain with fever, numbness, weakness, loss of bowel or bladder control, or inability to walk or urinate, you should return to the ER immediately.  Please follow up with your doctor this week for a recheck if still having symptoms.

## 2017-07-29 NOTE — ED Provider Notes (Signed)
[facility]   CSN: [id number] Arrival date & time: 07/29/17  2127     History   Chief Complaint Chief Complaint  Patient presents with  . Back Pain    HPI Donald Briggs is a 41 y.o. male who present to the emergency department with complaints of back pain for the past 1 week. He describes the pain as a constant ache to his right lower back.  States his pain is a 9 out of 10 in severity that worsens with movement and is alleviated by nothing.  He has not tried any at home interventions.  He does not recall any specific injury to the back, however he does a lot of heavy lifting at work and this has increased lately. Denies numbness, tingling, weakness, incontinence to bowel/bladder, fever, chills, IV drug use, or hx of cancer. Friend acted as Nurse, learning disabilitytranslator.  HPI  History reviewed. No pertinent past medical history.  There are no active problems to display for this patient.   History reviewed. No pertinent surgical history.     Home Medications    Prior to Admission medications   Medication Sig Start Date End Date Taking? Authorizing Provider  diclofenac (CATAFLAM) 50 MG tablet Take 1 tablet (50 mg total) by mouth 3 (three) times daily. One tablet TID with food prn pain. 04/30/16   Hayden RasmussenMabe, David, NP  methocarbamol (ROBAXIN) 500 MG tablet Take 1 tablet (500 mg total) by mouth 2 (two) times daily. 07/29/17   Lizzet Hendley R, PA-C  naproxen (NAPROSYN) 500 MG tablet Take 1 tablet (500 mg total) by mouth 2 (two) times daily. 07/29/17   Laquandra Carrillo R, PA-C  ondansetron (ZOFRAN ODT) 4 MG disintegrating tablet 4mg  ODT q4 hours prn nausea/vomit 12/24/13   Hess, Robyn M, PA-C  pantoprazole (PROTONIX) 20 MG tablet Take 1 tablet (20 mg total) by mouth daily. 12/24/13   Hess, Nada Boozerobyn M, PA-C    Family History No family history on file.  Social History Social History   Tobacco Use  . Smoking status: Current Every Day Smoker  .  Smokeless tobacco: Never Used  . Tobacco comment: 2 cigarettes per day   Substance Use Topics  . Alcohol use: Yes  . Drug use: No     Allergies   Patient has no known allergies.   Review of Systems Review of Systems  Constitutional: Negative for chills and fever.  Genitourinary: Negative for dysuria and hematuria.  Musculoskeletal: Positive for back pain.  Neurological: Negative for weakness and numbness.       Negative for incontinence     Physical Exam Updated Vital Signs BP 127/68 (BP Location: Left Arm)   Pulse 62   Temp 98.4 F (36.9 C) (Oral)   Resp 16   SpO2 100%   Physical Exam  Constitutional: He appears well-developed and well-nourished. No distress.  HENT:  Head: Normocephalic and atraumatic.  Eyes: Conjunctivae are normal.  Cardiovascular:  Pulses:      Radial pulses are 2+ on the right side, and 2+ on the left side.       Dorsalis pedis pulses are 2+ on the right side, and 2+ on the left side.  Musculoskeletal:  Back: No erythema, fluctuance, wounds, or warmth. No midline vertebral tenderness. R lumbar paraspinal muscle tenderness and spasm to palpation.  Extremities: no bony tenderness.   Neurological:  Alert. Clear speech. Bilateral upper and lower extremities' sensation intact to sharp and dull touch. 5/5 grip strength bilaterally. 5/5  plantar and dorsi flexion bilaterally. Patellar DTRs are 2+ and symmetric Gait is normal.  Psychiatric: He has a normal mood and affect. His behavior is normal. Thought content normal.  Nursing note and vitals reviewed.    ED Treatments / Results  Labs (all labs ordered are listed, but only abnormal results are displayed) Labs Reviewed - No data to display  EKG  EKG Interpretation None      Radiology No results found.  Procedures Procedures (including critical care time)  Medications Ordered in ED Medications  oxyCODONE-acetaminophen (PERCOCET/ROXICET) 5-325 MG per tablet 1 tablet (1 tablet Oral Given  07/29/17 2209)    Initial Impression / Assessment and Plan / ED Course  I have reviewed the triage vital signs and the nursing notes.  Pertinent labs & imaging results that were available during my care of the patient were reviewed by me and considered in my medical decision making (see chart for details).  Patient presents with back pain in the setting of heavy lifting at work.  Patient is nontoxic appearing with stable vital signs.  Pain is in the lumbar R paraspinal muscle region, no vertebral tenderness.  No neurological deficits and normal neuro exam. Do not believe imaging is indicated at this time. Patient able to ambulate. No loss of bowel or bladder control.  No concern for cauda equina.  No fever, night sweats, weight loss, h/o cancer, IVDU.  RICE protocol and pain medicine with Naproxen and Robaxin. I discussed treatment plan, need for PCP follow-up, and return precautions with the patient and friend who acted as Nurse, learning disabilitytranslator. Provided opportunity for questions, patient confirmed understanding and is in agreement with plan.      Vitals:   07/29/17 2201 07/29/17 2333  BP: 127/68 108/73  Pulse: 62 (!) 58  Resp: 16 18  Temp: 98.4 F (36.9 C)   SpO2: 100% 100%    Final Clinical Impressions(s) / ED Diagnoses   Final diagnoses:  Acute right-sided low back pain without sciatica    ED Discharge Orders        Ordered    naproxen (NAPROSYN) 500 MG tablet  2 times daily     07/29/17 2326    methocarbamol (ROBAXIN) 500 MG tablet  2 times daily     07/29/17 2326       Valynn Schamberger, Pleas KochSamantha R, PA-C 07/30/17 0053    Benjiman CorePickering, Nathan, MD 07/30/17 [phone]

## 2017-07-30 ENCOUNTER — Encounter (HOSPITAL_COMMUNITY): Payer: Self-pay | Admitting: Student

## 2017-08-09 ENCOUNTER — Ambulatory Visit: Payer: Self-pay

## 2017-08-09 ENCOUNTER — Ambulatory Visit: Payer: Self-pay | Attending: Nurse Practitioner | Admitting: Nurse Practitioner

## 2017-08-09 ENCOUNTER — Encounter: Payer: Self-pay | Admitting: Nurse Practitioner

## 2017-08-09 VITALS — BP 104/71 | HR 68 | Temp 98.6°F | Ht 65.0 in | Wt 137.6 lb

## 2017-08-09 DIAGNOSIS — M549 Dorsalgia, unspecified: Secondary | ICD-10-CM | POA: Insufficient documentation

## 2017-08-09 DIAGNOSIS — F17210 Nicotine dependence, cigarettes, uncomplicated: Secondary | ICD-10-CM | POA: Insufficient documentation

## 2017-08-09 DIAGNOSIS — Z0000 Encounter for general adult medical examination without abnormal findings: Secondary | ICD-10-CM | POA: Insufficient documentation

## 2017-08-09 DIAGNOSIS — Z79899 Other long term (current) drug therapy: Secondary | ICD-10-CM | POA: Insufficient documentation

## 2017-08-09 DIAGNOSIS — R0781 Pleurodynia: Secondary | ICD-10-CM | POA: Insufficient documentation

## 2017-08-09 DIAGNOSIS — F17200 Nicotine dependence, unspecified, uncomplicated: Secondary | ICD-10-CM

## 2017-08-09 DIAGNOSIS — M546 Pain in thoracic spine: Secondary | ICD-10-CM | POA: Insufficient documentation

## 2017-08-09 MED ORDER — TRAMADOL HCL 50 MG PO TABS: 50.0000 mg | ORAL_TABLET | Freq: Three times a day (TID) | ORAL | 0 refills | Status: AC | PRN

## 2017-08-09 MED FILL — traMADol HCL 50 MG TABS: 50 | 12 days supply | Qty: 36 | Fill #0

## 2017-08-09 NOTE — Patient Instructions (Addendum)
Costocondritis (Costochondritis) La costocondritis es la hinchazn e irritacin del tejido (cartlago) que une las costillas con el hueso del trax (esternn). Esto causa dolor en el pecho y la zona de las Gardenacostillas. Generalmente desaparece con Allied Waste Industriesel tiempo, sin tratamiento. CUIDADOS EN EL HOGAR  Evite las actividades que lo cansen [place] Sante[place]ucho.  No esfuerce sus costillas. Evite las Northeast Utilitiesactividades en las que tenga que emplear: ? El pecho. ? El vientre. ? Los The ServiceMaster Companymsculos de los lados.  Aplique hielo en la zona durante los 2 primeros das despus del comienzo del dolor. ? Ponga el hielo en una bolsa plstica. ? Colquese una FirstEnergy Corptoalla entre la piel y la bolsa de hielo. ? Deje el hielo durante 20 minutos, y aplquelo 2-3 veces por Futures traderda.  Slo tome los medicamentos que le haya indicado su mdico. SOLICITE AYUDA SI:  Tiene enrojecimiento e inflamacin (hinchazn) en la zona de las costillas.  El dolor no desaparece aunque haga reposo o tome medicamentos. SOLICITE AYUDA DE INMEDIATO SI:   El dolor empeora.  Siente muchas molestias.  Tiene dificultad para respirar.  Tose y escupe sangre.  Comienza a devolver (vomita).  Tiene fiebre o sntomas que persisten durante ms de 2-3 das.  Tiene fiebre y los sntomas empeoran repentinamente. ASEGRESE DE QUE:   Comprende estas instrucciones.  Controlar su afeccin.  Recibir ayuda de inmediato si no mejora o si empeora. Esta informacin no tiene Theme park managercomo fin reemplazar el consejo del mdico. Asegrese de hacerle al mdico cualquier pregunta que tenga. Document Released: 09/15/2010 Document Revised: 04/15/2013 Elsevier Interactive Patient Education  2017 [employer]vier Inc.  Dolor en la pared torcica (Chest Wall Pain) El dolor en la pared torcica se produce en los huesos y los msculos del pecho o alrededor de Scientist, product/process developmentestos. A veces, una lesin Occupational psychologistcausa este dolor. En ocasiones, la causa puede ser desconocida. Este dolor puede durar varias semanas. CUIDADOS EN EL  HOGAR Est atento a cualquier cambio en los sntomas. Tome estas medidas para Acupuncturistaliviar el dolor:  Sports administratorHaga reposo tal como le indic el mdico.  Evite las actividades que causan dolor. Intente no usar los [address]-7[address]culos del trax, los del vientre (abdominales) o los laterales para levantar objetos pesados.  Si se lo indican, aplique hielo sobre la zona dolorida: ? Nature conservation officeronga el hielo en una bolsa plstica. ? Coloque una FirstEnergy Corptoalla entre la piel y la bolsa de hielo. ? Coloque el hielo durante 20minutos, 2 a 3veces por da.  Tome los medicamentos de venta libre y los recetados solamente como se lo haya indicado el mdico.  No consuma productos que contengan tabaco, incluidos cigarrillos, tabaco de Theatre managermascar y Administrator, Civil Servicecigarrillos electrnicos. Si necesita ayuda para dejar de fumar, consulte al mdico.  Concurra a todas las visitas de control como se lo haya indicado el mdico. Esto es importante. SOLICITE AYUDA SI:  Tiene fiebre.  El dolor en el pecho Gold Hillempeora.  Aparecen nuevos sntomas. SOLICITE AYUDA DE INMEDIATO SI:  Tiene malestar estomacal (nuseas) o vomita.  Berenice Primasranspira o tiene sensacin de desvanecimiento.  Tiene tos con flema (esputo) o expectora sangre al toser.  Comienza a sentir falta de aire. Esta informacin no tiene Theme park managercomo fin reemplazar el consejo del mdico. Asegrese de hacerle al mdico cualquier pregunta que tenga. Document Released: 08/02/2011 Document Revised: 05/04/2015 Document Reviewed: 11/08/2014 Elsevier Interactive Patient Education  [employer]2018 Elsevier Inc.

## 2017-08-09 NOTE — Progress Notes (Signed)
Assessment & Plan:  Donald Briggs was seen today for back pain.  Diagnoses and all orders for this visit:  Rib pain on right side -     traMADol (ULTRAM) 50 MG tablet; Take 1 tablet (50 mg total) by mouth every 8 (eight) hours as needed for up to 21 days. -     Lipase May alternate with ice and heat to affected area May alternate naproxen with tramadol for pain   Acute right-sided thoracic back pain -     traMADol (ULTRAM) 50 MG tablet; Take 1 tablet (50 mg total) by mouth every 8 (eight) hours as needed for up to 21 days.  Routine health maintenance -     CBC -     Lipid panel -     Basic metabolic panel  Tobacco dependence Donald Briggs was counseled on the dangers of tobacco use, and was advised to quit. Reviewed strategies to maximize success, including removing cigarettes and smoking materials from environment, stress management and support of family/friends as well as pharmacological alternatives including: Wellbutrin, Chantix, Nicotine patch, Nicotine gum or lozenges. Smoking cessation support: smoking cessation hotline: 1-800-QUIT-NOW.  Smoking cessation classes are also available through [facility]. Call [phone] or visit our website at [email or web address].   Spent 4 minutes counseling on smoking cessation and patient is not ready to quit.   Patient has been counseled on age-appropriate routine health concerns for screening and prevention. These are reviewed and up-to-date. Referrals have been placed accordingly. Immunizations are up-to-date or declined.    Subjective:   Chief Complaint  Patient presents with  . Back Pain    Patient stated that his upper back radiates to his front hurts when he breathe, coughs, and movements. Patient stated that he may have blood in his stool.   HPI  VRI was used to communicate directly with patient for the entire encounter including providing detailed patient instructions.  Donald Briggs 41 y.o. male presents to  office today to establish care.   Back Pain Patient presents for presents evaluation of upper and lower back problems.  Symptoms have been present for 3 months and include pain in thoracic spine which radiates to right midaxiallary region (aching, sharp and stabbing in character; 8/10 in severity) as well as lumbar pain.  Initial inciting event: none. Symptoms are worst: afternoon after he is finished working. Alleviating factors identifiable by patient are rest.. Exacerbating factors identifiable by patient are heavy lifting, coughing, movement. Treatments so far initiated by patient: none Previous lower back problems: he was evaluated in the ED for low back pain and prescribed robaxin and naproxen which provided some but not total relief of his low back pain and no relief of his thoracic back pain. Marland Kitchen. Previous workup: none.  He does heavy lifting up to 8 hours a day. He does not wear a back brace when he is working. He is a smoker. He denies chest pain or fevers. He also denies any urinary or GI symptoms.   Review of Systems  Constitutional: Negative for fever, malaise/fatigue and weight loss.  HENT: Negative.  Negative for nosebleeds.   Eyes: Negative.  Negative for blurred vision, double vision and photophobia.  Respiratory: Negative.  Negative for cough and shortness of breath.   Cardiovascular: Negative.  Negative for chest pain, palpitations and leg swelling.  Gastrointestinal: Negative.  Negative for abdominal pain, constipation, diarrhea, heartburn, nausea and vomiting.  Musculoskeletal: Positive for back pain and myalgias.  Neurological: Negative.  Negative for  dizziness, focal weakness, seizures and headaches.  Endo/Heme/Allergies: Negative for environmental allergies.  Psychiatric/Behavioral: Negative.  Negative for suicidal ideas.    History reviewed. No pertinent past medical history.  History reviewed. No pertinent surgical history.  Family History  Problem Relation Age of Onset    . Cancer Mother     Social History Reviewed with no changes to be made today.   Outpatient Medications Prior to Visit  Medication Sig Dispense Refill  . methocarbamol (ROBAXIN) 500 MG tablet Take 1 tablet (500 mg total) by mouth 2 (two) times daily. 20 tablet 0  . naproxen (NAPROSYN) 500 MG tablet Take 1 tablet (500 mg total) by mouth 2 (two) times daily. 30 tablet 0  . ondansetron (ZOFRAN ODT) 4 MG disintegrating tablet 4mg  ODT q4 hours prn nausea/vomit 12 tablet 0  . pantoprazole (PROTONIX) 20 MG tablet Take 1 tablet (20 mg total) by mouth daily. 30 tablet 0  . diclofenac (CATAFLAM) 50 MG tablet Take 1 tablet (50 mg total) by mouth 3 (three) times daily. One tablet TID with food prn pain. 21 tablet 0   No facility-administered medications prior to visit.     No Known Allergies     Objective:    BP 104/71 (BP Location: Right Arm, Patient Position: Sitting, Cuff Size: Normal)   Pulse 68   Temp 98.6 F (37 C) (Oral)   Ht 5\' 5"  (1.651 m)   Wt 137 lb 9.6 oz (62.4 kg)   SpO2 94%   BMI 22.90 kg/m  Wt Readings from Last 3 Encounters:  08/09/17 137 lb 9.6 oz (62.4 kg)  02/08/15 138 lb 2 oz (62.7 kg)  12/28/13 127 lb 2 oz (57.7 kg)    Physical Exam  Constitutional: He is oriented to person, place, and time. He appears well-developed and well-nourished. He is cooperative.  HENT:  Head: Normocephalic and atraumatic.  Eyes: EOM are normal.  Neck: Normal range of motion.  Cardiovascular: Normal rate, regular rhythm, normal heart sounds and intact distal pulses. Exam reveals no gallop and no friction rub.  No murmur heard. Pulmonary/Chest: Effort normal and breath sounds normal. No tachypnea. No respiratory distress. He has no decreased breath sounds. He has no wheezes. He has no rhonchi. He has no rales. He exhibits no tenderness.  Abdominal: Soft. Bowel sounds are normal.  Musculoskeletal: Normal range of motion. He exhibits no edema.       Arms: Neurological: He is alert and  oriented to person, place, and time. Coordination normal.  Skin: Skin is warm and dry.  Psychiatric: He has a normal mood and affect. His behavior is normal. Judgment and thought content normal.  Nursing note and vitals reviewed.      Patient has been counseled extensively about nutrition and exercise as well as the importance of adherence with medications and regular follow-up. The patient was given clear instructions to go to ER or return to medical center if symptoms don't improve, worsen or new problems develop. The patient verbalized understanding.   Follow-up: Return in about 3 weeks (around 08/30/2017) for right sided pain.   Claiborne RiggZelda W Fleming, FNP-BC [facility] [place], [place] [phone]   08/14/2017, 10:51 PM

## 2017-08-10 LAB — BASIC METABOLIC PANEL
BUN / CREAT RATIO: 16 (ref 9–20)
BUN: 10 mg/dL (ref 6–24)
CO2: 23 mmol/L (ref 20–29)
CREATININE: 0.64 mg/dL — AB (ref 0.76–1.27)
Calcium: 9.1 mg/dL (ref 8.7–10.2)
Chloride: 104 mmol/L (ref 96–106)
GFR calc Af Amer: 140 mL/min/{1.73_m2} (ref >59)
GFR calc non Af Amer: 121 mL/min/{1.73_m2} (ref >59)
Glucose: 96 mg/dL (ref 65–99)
Potassium: 4.2 mmol/L (ref 3.5–5.2)
Sodium: 141 mmol/L (ref 134–144)

## 2017-08-10 LAB — LIPASE: Lipase: 41 U/L (ref 13–78)

## 2017-08-10 LAB — CBC
HEMATOCRIT: 43 % (ref 37.5–51.0)
HEMOGLOBIN: 14.6 g/dL (ref 13.0–17.7)
MCH: 30 pg (ref 26.6–33.0)
MCHC: 34 g/dL (ref 31.5–35.7)
MCV: 89 fL (ref 79–97)
PLATELETS: 180 10*3/uL (ref 150–379)
RBC: 4.86 x10E6/uL (ref 4.14–5.80)
RDW: 12.8 % (ref 12.3–15.4)
WBC: 6 10*3/uL (ref 3.4–10.8)

## 2017-08-10 LAB — LIPID PANEL
CHOL/HDL RATIO: 3.9 ratio (ref 0.0–5.0)
Cholesterol, Total: 136 mg/dL (ref 100–199)
HDL: 35 mg/dL — AB (ref >39)
LDL Calculated: 57 mg/dL (ref 0–99)
Triglycerides: 221 mg/dL — ABNORMAL HIGH (ref 0–149)
VLDL CHOLESTEROL CAL: 44 mg/dL — AB (ref 5–40)

## 2017-08-14 ENCOUNTER — Encounter: Payer: Self-pay | Admitting: Nurse Practitioner

## 2017-08-14 DIAGNOSIS — F17200 Nicotine dependence, unspecified, uncomplicated: Secondary | ICD-10-CM | POA: Insufficient documentation

## 2017-08-21 ENCOUNTER — Telehealth: Payer: Self-pay | Admitting: Nurse Practitioner

## 2017-08-21 NOTE — Telephone Encounter (Signed)
Pt. Wife called requesting lab results that were done on 08/09/17. Please f/u

## 2017-08-21 NOTE — Telephone Encounter (Signed)
Will route to PCP 

## 2017-08-27 NOTE — Telephone Encounter (Signed)
Labs results have been routed. Thank you

## 2017-08-28 NOTE — Telephone Encounter (Signed)
-----   Message from Claiborne RiggZelda W Fleming, NP sent at 08/27/2017  9:10 PM EST ----- Overall labs are essentially normal. Work on eating a  low fat, heart healthy diet and participate in a regular exercise program as well.

## 2017-08-28 NOTE — Telephone Encounter (Signed)
CMA attempt to call patient regarding about lab result. Patient did not answer. If patient call back, please let patient know; !!Overall labs are essentially normal. Work on eating a  low fat, heart healthy diet and participate in a regular exercise program as well.!!  Communication letter will be send out.

## 2017-08-28 NOTE — Telephone Encounter (Signed)
-----   Message from Zelda W Fleming, NP sent at 08/27/2017  9:10 PM EST ----- Overall labs are essentially normal. Work on eating a  low fat, heart healthy diet and participate in a regular exercise program as well. 

## 2017-08-29 ENCOUNTER — Telehealth: Payer: Self-pay | Admitting: Nurse Practitioner

## 2017-08-29 NOTE — Telephone Encounter (Signed)
Pt as informed of the lab result

## 2017-09-02 ENCOUNTER — Encounter: Payer: Self-pay | Admitting: Nurse Practitioner

## 2017-09-02 ENCOUNTER — Ambulatory Visit (HOSPITAL_COMMUNITY)
Admission: RE | Admit: 2017-09-02 | Discharge: 2017-09-02 | Disposition: A | Discharge status: Home / Self Care | Payer: Self-pay | Source: Ambulatory Visit | Attending: Nurse Practitioner | Admitting: Nurse Practitioner

## 2017-09-02 ENCOUNTER — Ambulatory Visit: Payer: Self-pay | Attending: Nurse Practitioner | Admitting: Nurse Practitioner

## 2017-09-02 VITALS — BP 115/76 | HR 68 | Temp 97.9°F | Ht 65.0 in | Wt 135.0 lb

## 2017-09-02 DIAGNOSIS — Z79899 Other long term (current) drug therapy: Secondary | ICD-10-CM | POA: Insufficient documentation

## 2017-09-02 DIAGNOSIS — Z716 Tobacco abuse counseling: Secondary | ICD-10-CM | POA: Insufficient documentation

## 2017-09-02 DIAGNOSIS — M546 Pain in thoracic spine: Secondary | ICD-10-CM | POA: Insufficient documentation

## 2017-09-02 DIAGNOSIS — M5134 Other intervertebral disc degeneration, thoracic region: Secondary | ICD-10-CM | POA: Insufficient documentation

## 2017-09-02 DIAGNOSIS — F17200 Nicotine dependence, unspecified, uncomplicated: Secondary | ICD-10-CM | POA: Insufficient documentation

## 2017-09-02 DIAGNOSIS — M2578 Osteophyte, vertebrae: Secondary | ICD-10-CM | POA: Insufficient documentation

## 2017-09-02 DIAGNOSIS — G8929 Other chronic pain: Secondary | ICD-10-CM | POA: Insufficient documentation

## 2017-09-02 DIAGNOSIS — R42 Dizziness and giddiness: Secondary | ICD-10-CM | POA: Insufficient documentation

## 2017-09-02 MED ORDER — PREDNISONE 20 MG PO TABS: ORAL_TABLET | ORAL | 0 refills | Status: DC

## 2017-09-02 MED FILL — predniSONE 20 MG TABS: 20 | 8 days supply | Qty: 13 | Fill #0

## 2017-09-02 NOTE — Patient Instructions (Addendum)
Ejercicios para la espalda (Back Exercises) Los siguientes ejercicios fortalecen los msculos que dan soporte a la espalda y, adems, ayudan a mantener la flexibilidad de la zona lumbar. Hacer estos ejercicios puede ser de ayuda para evitar el dolor de espalda o aliviar el dolor actual. Si tiene dolor o molestias en la espalda, intente hacer estos ejercicios 2 o 3veces por da, o como se lo haya indicado el mdico. Cuando el dolor desaparezca, hgalos una vez por da, pero haga ms repeticiones de cada ejercicio. Si no tiene dolor o molestias en la espalda, haga estos ejercicios una vez por da o como se lo haya indicado el mdico. EJERCICIOS Rodilla al pecho Repita estos pasos 3 o 5veces con cada pierna: 1. Acustese boca arriba sobre una cama dura o sobre el suelo con las piernas extendidas. 2. Lleve una rodilla al pecho. La otra pierna debe quedar extendida y en contacto con el suelo. 3. Mantenga la rodilla contra el pecho. Para lograrlo tmese la rodilla o el muslo. 4. Tire de la rodilla hasta sentir una elongacin suave en la parte baja de la espalda. 5. Mantenga la elongacin durante 10 a 30segundos. 6. Suelte y extienda la pierna lentamente. Inclinacin de la pelvis Repita estos pasos 5 o 10veces: 1. Acustese boca arriba sobre una cama dura o sobre el suelo con las piernas extendidas. 2. Flexione las rodillas de modo que apunten al techo y los pies queden apoyados en el suelo. 3. Contraiga los msculos de la parte baja del abdomen para empujar la zona lumbar contra el suelo. Con este movimiento se inclinar la pelvis de modo que el cccix apunte hacia el techo, en lugar de apuntar en direccin a los pies o al suelo. 4. Contraiga suavemente y respire con normalidad mientras mantiene esta posicin durante 5 a 10segundos. El perro y el gato Repita estos pasos hasta que la zona lumbar se vuelva ms flexible: 1. Apoye las palmas de las manos y las rodillas sobre una superficie firme. Las  manos deben estar alineadas con los hombros y las rodillas con las caderas. Puede colocarse almohadillas debajo de las rodillas para estar cmodo. 2. Deje caer la cabeza y baje el cccix en direccin al suelo de modo que la zona lumbar se arquee como el lomo de un gato asustado. 3. Mantenga esta posicin durante 5segundos. 4. Lentamente, levante la cabeza y eleve el cccix de modo que apunte en direccin al techo para que la espalda forme un arco hundido como el lomo de un perro contento. 5. Mantenga esta posicin durante 5segundos. Flexiones de brazos Repita estos pasos 5 o 10veces: 1. Acustese boca abajo en el suelo. 2. Coloque las palmas de las manos cerca de la cabeza, separadas aproximadamente al ancho de los hombros. 3. Con la espalda lo ms relajada posible y las caderas apoyadas en el suelo, extienda lentamente los brazos para levantar la mitad superior del cuerpo y elevar los hombros. No use los msculos de la espalda para elevar la parte superior del torso. Puede cambiar las manos de lugar para estar ms cmodo. 4. Mantenga esta posicin durante 5segundos mientras mantiene la espalda relajada. 5. Lentamente vuelva a la posicin horizontal. Puentes Repita estos pasos 10veces: 1. Acustese boca arriba sobre una superficie firme. 2. Flexione las rodillas de modo que apunten al techo y los pies queden apoyados en el suelo. 3. Contraiga los glteos y despegue las nalgas del suelo hasta que la cintura est casi a la misma altura que las rodillas. Debe   sentir el trabajo muscular en las nalgas y la parte posterior de los muslos. Si no siente el esfuerzo de BorgWarnerestos msculos, aleje los pies 1 o 2pulgadas (2,5 o 5centmetros) de las nalgas. 4. Mantenga esta posicin durante 3 a 5segundos. 5. Baje lentamente las caderas a la posicin inicial y relaje los glteos por completo. Si este ejercicio le resulta muy fcil, intente realizarlo con los brazos cruzados Port[place]obre el  pecho. Abdominales Repita estos pasos 5 o 10veces: 1. Acustese boca arriba sobre una cama dura o sobre el suelo con las piernas extendidas. 2. Flexione las rodillas de modo que apunten al techo y los pies queden apoyados en el suelo. 3. Cruce los [employer] Services Corporationbrazos sobre el pecho. 4. Baje levemente el mentn en direccin al pecho sin doblar el cuello. 5. Contraiga los msculos del abdomen y con lentitud eleve el torso lo suficiente como para Artistdespegar levemente los omplatos del suelo. No eleve el torso ms alto que eso, porque esto puede sobreexigir a la zona lumbar y no ayuda a Investment banker, operationalfortalecer los msculos abdominales. 6. Regrese lentamente a la posicin inicial. Elevaciones de espalda Repita estos pasos 5 o 10veces: 1. Acustese boca abajo con los brazos a los costados del cuerpo y apoye la frente en el suelo. 2. Contraiga los msculos de las piernas y los glteos. 3. Lentamente despegue el pecho del suelo Sonic Automotivemientras mantiene las caderas bien apoyadas en el suelo. Mantenga la nuca alineada con la curvatura de la espalda. Los ojos deben mirar al suelo. 4. Mantenga esta posicin durante 3 a 5segundos. 5. Regrese lentamente a la posicin inicial. SOLICITE ATENCIN MDICA SI:  El dolor o las molestias en la espalda se vuelven mucho ms intensos cuando hace un ejercicio.  El dolor o las molestias en la espalda no se Copyalivian en el trmino de las 2horas posteriores a Copyhacer los ejercicios. Si tiene alguno de Limited Brandsestos problemas, deje de ARAMARK Corporationhacer los ejercicios de inmediato. No vuelva a hacer los ejercicios a menos que el mdico lo autorice. SOLICITE ATENCIN MDICA DE INMEDIATO SI:  Siente un dolor sbito e intenso en la espalda. Si esto ocurre, deje de ARAMARK Corporationhacer los ejercicios de inmediato. No vuelva a hacer los ejercicios a menos que el mdico lo autorice.  Esta informacin no tiene Theme park managercomo fin reemplazar el consejo del mdico. Asegrese de hacerle al mdico cualquier pregunta que tenga. Document Released: 08/13/2005  Document Revised: 05/04/2015 Document Reviewed: 10/07/2014 Elsevier Interactive Patient Education  2017 [employer]  Ejercicios para la espalda (Back Exercises) Los siguientes ejercicios fortalecen los msculos que dan soporte a la espalda y, [place], ayudan a Pharmacologistmantener la flexibilidad de la zona lumbar. Hacer estos ejercicios puede ser de ayuda para Psychologist, sport and exerciseevitar el dolor de espalda o Engineer, materialsaliviar el dolor actual. Si tiene dolor o molestias en la espalda, intente hacer estos ejercicios 2 o 3veces por da, o como se lo haya indicado el mdico. Cuando el dolor desaparezca, hgalos una vez por da, pero haga ms repeticiones de cada ejercicio. Si no tiene dolor o Foot Lockermolestias en la espalda, haga estos ejercicios una vez por da o como se lo haya indicado el mdico. EJERCICIOS Rodilla al pecho Repita estos pasos 3 o 5veces con cada pierna: 7. Acustese boca arriba sobre una cama dura o sobre el suelo con las piernas extendidas. 8. Lleve una rodilla al pecho. La otra pierna debe quedar extendida y en contacto con el suelo. 9. Mantenga la rodilla contra el pecho. Para lograrlo tmese la rodilla o el muslo. 10. Tire de  la rodilla hasta sentir una elongacin suave en la parte baja de la espalda. 11. Mantenga la elongacin durante 10 a 30segundos. 12. Suelte y extienda la pierna lentamente. Inclinacin de la pelvis Repita estos pasos 5 o 10veces: 5. Acustese boca arriba sobre una cama dura o sobre el suelo con las piernas extendidas. 6. Flexione las rodillas de modo que apunten al techo y los pies queden apoyados en el suelo. 7. Contraiga los msculos de la parte baja del abdomen para empujar la zona lumbar contra el suelo. Con este movimiento se inclinar la pelvis de modo que el cccix apunte hacia el techo, en lugar de apuntar en direccin a los pies o al suelo. 8. Contraiga suavemente y respire con normalidad mientras mantiene esta posicin durante 5 a 10segundos. El perro y el gato Repita estos pasos  hasta que la zona lumbar se vuelva ms flexible: 6. Apoye las palmas de las manos y las rodillas sobre una superficie firme. Las manos deben estar alineadas con los hombros y las rodillas con las caderas. Puede colocarse almohadillas debajo de las rodillas para estar cmodo. 7. Deje caer la cabeza y baje el cccix en direccin al suelo de modo que la zona lumbar se arquee como el lomo de un gato [place]. 8. Mantenga esta posicin durante 5segundos. 9. Lentamente, levante la cabeza y eleve el cccix de modo que apunte en direccin al techo para que la espalda forme un arco hundido como el lomo de un perro contento. 10. Mantenga esta posicin durante 5segundos. Flexiones de Public house manager pasos 5 o 10veces: 6. Acustese boca abajo en el suelo. 7. Coloque las palmas de las manos cerca de la cabeza, separadas aproximadamente al ancho de los hombros. 8. Con la espalda lo ms relajada posible y las caderas apoyadas en el suelo, extienda lentamente los brazos para levantar la mitad superior del cuerpo y Optometrist los hombros. No use los msculos de la espalda para elevar la parte superior del torso. Puede cambiar las manos de lugar para estar ms cmodo. 9. Mantenga esta posicin durante 5segundos mientras mantiene la espalda relajada. 10. Lentamente vuelva a la posicin horizontal. Puentes Repita estos pasos 10veces: 6. Acustese boca arriba sobre una superficie firme. 7. Flexione las rodillas de modo que apunten al techo y los pies queden apoyados en el suelo. 8. Contraiga los glteos y despegue las nalgas del suelo hasta que la cintura est casi a la misma altura que las rodillas. Debe sentir el trabajo muscular en las nalgas y la parte posterior de los muslos. Si no siente el esfuerzo de [employer], aleje los pies 1 o 2pulgadas (2,5 o 5centmetros) de las nalgas. 9. Mantenga esta posicin durante 3 a 5segundos. 10. Baje lentamente las caderas a la posicin inicial y relaje los glteos  por completo. Si este ejercicio le resulta muy fcil, intente realizarlo con los brazos cruzados [place]. Abdominales Repita estos pasos 5 o 10veces: 7. Acustese boca arriba sobre una cama dura o sobre el suelo con las piernas extendidas. 8. Flexione las rodillas de modo que apunten al techo y los pies queden apoyados en el suelo. 9. Cruce los brazos [employer]. 10. Baje levemente el mentn en direccin al pecho sin doblar el cuello. 11. Contraiga los msculos del abdomen y con lentitud eleve el torso lo suficiente como para Artist los omplatos del suelo. No eleve el torso ms alto que eso, porque esto puede sobreexigir a la zona lumbar y no ayuda a  fortalecer los msculos abdominales. 12. Regrese lentamente a la posicin inicial. Elevaciones de espalda Repita estos pasos 5 o 10veces: 6. Acustese boca abajo con los brazos a los costados del cuerpo y apoye la frente en el suelo. 7. Contraiga los msculos de las piernas y los glteos. 8. Lentamente despegue el pecho del suelo [employer] las caderas bien apoyadas en el suelo. Mantenga la nuca alineada con la curvatura de la espalda. Los ojos deben mirar al suelo. 9. Mantenga esta posicin durante 3 a 5segundos. 10. Regrese lentamente a la posicin inicial. SOLICITE ATENCIN MDICA SI:  El dolor o las molestias en la espalda se vuelven mucho ms intensos cuando hace un ejercicio.  El dolor o las molestias en la espalda no se Copy en el trmino de las 2horas posteriores a Copy. Si tiene alguno de Limited Brands, deje de [employer] ejercicios de inmediato. No vuelva a hacer los ejercicios a menos que el mdico lo autorice. SOLICITE ATENCIN MDICA DE INMEDIATO SI:  Siente un dolor sbito e intenso en la espalda. Si esto ocurre, deje de [employer] ejercicios de inmediato. No vuelva a hacer los ejercicios a menos que el mdico lo autorice.  Esta informacin no tiene Theme park manager el  consejo del mdico. Asegrese de hacerle al mdico cualquier pregunta que tenga. Document Released: 08/13/2005 Document Revised: 05/04/2015 Document Reviewed: 10/07/2014 Elsevier Interactive Patient Education  2017 [employer].

## 2017-09-02 NOTE — Progress Notes (Signed)
Assessment & Plan:  Norton PastelBenigno was seen today for follow-up.  Diagnoses and all orders for this visit:  Chronic right-sided thoracic back pain -     DG Thoracic Spine 2 View; Future -     predniSONE (DELTASONE) 20 MG tablet; Take 3 tablets on days 1-2; Take 2 tablets on days 3-4; Take 1 tablet on days 5-6; take 1/2 tablet on days 7-8; then stop.  Tobacco Dependence Mansour was counseled on the dangers of tobacco use, and was advised to quit. Reviewed strategies to maximize success, including removing cigarettes and smoking materials from environment, stress management and support of family/friends as well as pharmacological alternatives including: Wellbutrin, Chantix, Nicotine patch, Nicotine gum or lozenges. Smoking cessation support: smoking cessation hotline: 1-800-QUIT-NOW.  Smoking cessation classes are also available through [facility]. Call [phone] or visit our website at [email or web address].   Spent 4 minutes counseling on smoking cessation and patient is not ready to quit. Patient has been counseled on age-appropriate routine health concerns for screening and prevention. These are reviewed and up-to-date. Referrals have been placed accordingly. Immunizations are up-to-date or declined.      Subjective:   Chief Complaint  Patient presents with  . Follow-up    Patient is here for a follow-up. Patient stated the Tramadol for pain made him dizzy. Patient stated he gets pain on his right abdomen.    HPI  Male interpreter was used for office visit today.  Donald Briggs 42 y.o. male presents to office today for follow up to his right sided pain. He was seen 2 weeks ago with complaints of thoracic back pain and was prescribed tramadol at that time due to his pain being chronic in nature. Today he reports his pain has persisted despite taking tramadol. His pain is aggravated with movement. He reports when he works 5 days he has trouble breathing along with  increased right sided pain. He works in [employer] and does very heavy lifting. He does not wear a back brace.  Review of Systems  Constitutional: Negative for chills, diaphoresis, fever, malaise/fatigue and weight loss.  Respiratory: Negative.  Negative for cough, sputum production and shortness of breath.   Cardiovascular: Negative.  Negative for chest pain, palpitations, claudication and leg swelling.  Gastrointestinal: Negative for abdominal pain, constipation, diarrhea and heartburn.  Musculoskeletal: Positive for back pain and myalgias. Negative for falls, joint pain and neck pain.  Neurological: Negative for dizziness, tingling, tremors, sensory change, speech change, focal weakness, loss of consciousness, weakness and headaches.  Psychiatric/Behavioral: Positive for depression. Negative for hallucinations, memory loss, substance abuse and suicidal ideas. The patient is not nervous/anxious and does not have insomnia.     History reviewed. No pertinent past medical history.  History reviewed. No pertinent surgical history.  Family History  Problem Relation Age of Onset  . Cancer Mother     Social History Reviewed with no changes to be made today.   Outpatient Medications Prior to Visit  Medication Sig Dispense Refill  . pantoprazole (PROTONIX) 20 MG tablet Take 1 tablet (20 mg total) by mouth daily. (Patient not taking: Reported on 09/02/2017) 30 tablet 0  . methocarbamol (ROBAXIN) 500 MG tablet Take 1 tablet (500 mg total) by mouth 2 (two) times daily. (Patient not taking: Reported on 09/02/2017) 20 tablet 0  . naproxen (NAPROSYN) 500 MG tablet Take 1 tablet (500 mg total) by mouth 2 (two) times daily. (Patient not taking: Reported on 09/02/2017) 30 tablet 0  . ondansetron (  ZOFRAN ODT) 4 MG disintegrating tablet 4mg  ODT q4 hours prn nausea/vomit (Patient not taking: Reported on 09/02/2017) 12 tablet 0   No facility-administered medications prior to visit.     No Known Allergies      Objective:    BP 115/76 (BP Location: Right Arm, Patient Position: Sitting, Cuff Size: Normal)   Pulse 68   Temp 97.9 F (36.6 C) (Oral)   Ht 5\' 5"  (1.651 m)   Wt 135 lb (61.2 kg)   SpO2 99%   BMI 22.47 kg/m  Wt Readings from Last 3 Encounters:  09/02/17 135 lb (61.2 kg)  08/09/17 137 lb 9.6 oz (62.4 kg)  02/08/15 138 lb 2 oz (62.7 kg)    Physical Exam  Constitutional: He is oriented to person, place, and time. He appears well-developed and well-nourished.  HENT:  Head: Normocephalic.  Eyes: EOM are normal.  Neck: Normal range of motion.  Cardiovascular: Normal rate, regular rhythm and normal heart sounds.  Pulmonary/Chest: Effort normal and breath sounds normal. No respiratory distress. He has no wheezes. He has no rales. He exhibits no tenderness.  Musculoskeletal: Normal range of motion.       Thoracic back: He exhibits pain.  Neurological: He is alert and oriented to person, place, and time.  Skin: Skin is warm and dry. No rash noted. No erythema. No pallor.  Psychiatric: He has a normal mood and affect. His behavior is normal. Judgment and thought content normal.      Patient has been counseled extensively about nutrition and exercise as well as the importance of adherence with medications and regular follow-up. The patient was given clear instructions to go to ER or return to medical center if symptoms don't improve, worsen or new problems develop. The patient verbalized understanding.   Follow-up: Return in about 3 weeks (around 09/23/2017).   Claiborne Rigg, FNP-BC [facility] [place], [place] [phone]   09/04/2017, 10:29 PM

## 2017-09-04 ENCOUNTER — Encounter: Payer: Self-pay | Admitting: Nurse Practitioner

## 2017-09-05 NOTE — Telephone Encounter (Signed)
Patient's wife informed on Xray result and will discuss with us on next office visit.

## 2017-09-05 NOTE — Telephone Encounter (Signed)
-----   Message from Claiborne RiggZelda W Fleming, NP sent at 09/04/2017 10:39 PM EST ----- Xray of spine shows osteoarthritis. This is best treated with anti inflammatories such as ibuprofen and naprosyn. We can discuss further at your next office visit

## 2017-09-25 ENCOUNTER — Ambulatory Visit: Payer: Self-pay | Admitting: Nurse Practitioner

## 2017-10-07 ENCOUNTER — Encounter: Payer: Self-pay | Admitting: Nurse Practitioner

## 2017-10-07 ENCOUNTER — Ambulatory Visit: Payer: Self-pay | Attending: Nurse Practitioner | Admitting: Nurse Practitioner

## 2017-10-07 VITALS — BP 98/61 | HR 76 | Temp 98.3°F | Ht 65.0 in | Wt 133.8 lb

## 2017-10-07 DIAGNOSIS — Z23 Encounter for immunization: Secondary | ICD-10-CM | POA: Insufficient documentation

## 2017-10-07 DIAGNOSIS — M2578 Osteophyte, vertebrae: Secondary | ICD-10-CM | POA: Insufficient documentation

## 2017-10-07 DIAGNOSIS — M25561 Pain in right knee: Secondary | ICD-10-CM | POA: Insufficient documentation

## 2017-10-07 DIAGNOSIS — G8929 Other chronic pain: Secondary | ICD-10-CM

## 2017-10-07 DIAGNOSIS — M546 Pain in thoracic spine: Secondary | ICD-10-CM | POA: Insufficient documentation

## 2017-10-07 MED ORDER — MELOXICAM 15 MG PO TABS: 15.0000 mg | ORAL_TABLET | Freq: Every day | ORAL | 0 refills | Status: DC

## 2017-10-07 MED ORDER — DICLOFENAC SODIUM 1 % TD GEL: 2.0000 g | Freq: Four times a day (QID) | TRANSDERMAL | 1 refills | Status: AC

## 2017-10-07 MED FILL — DICLOFENAC SODIUM 1% GEL: 1 | 12 days supply | Qty: 100 | Fill #0

## 2017-10-07 MED FILL — MELOXICAM 15 MG TABLET: 15 | 30 days supply | Qty: 30 | Fill #0

## 2017-10-07 NOTE — Patient Instructions (Addendum)
Knee Pain, Adult Many things can cause knee pain. The pain often goes away on its own with time and rest. If the pain does not go away, tests may be done to find out what is causing the pain. Follow these instructions at home: Activity  Rest your knee.  Do not do things that cause pain.  Avoid activities where both feet leave the ground at the same time (high-impact activities). Examples are running, jumping rope, and doing jumping jacks. General instructions  Take medicines only as told by your doctor.  Raise (elevate) your knee when you are resting. Make sure your knee is higher than your heart.  Sleep with a pillow under your knee.  If told, put ice on the knee: ? Put ice in a plastic bag. ? Place a towel between your skin and the bag. ? Leave the ice on for 20 minutes, 2-3 times a day.  Ask your doctor if you should wear an elastic knee support.  Lose weight if you are overweight. Being overweight can make your knee hurt more.  Do not use any tobacco products. These include cigarettes, chewing tobacco, or electronic cigarettes. If you need help quitting, ask your doctor. Smoking may slow down healing. Contact a doctor if:  The pain does not stop.  The pain changes or gets worse.  You have a fever along with knee pain.  Your knee gives out or locks up.  Your knee swells, and becomes worse. Get help right away if:  Your knee feels warm.  You cannot move your knee.  You have very bad knee pain.  You have chest pain.  You have trouble breathing. Summary  Many things can cause knee pain. The pain often goes away on its own with time and rest.  Avoid activities that put stress on your knee. These include running and jumping rope.  Get help right away if you cannot move your knee, or if your knee feels warm, or if you have trouble breathing. This information is not intended to replace advice given to you by your health care provider. Make sure you discuss any  questions you have with your health care provider. Document Released: 11/09/2008 Document Revised: 08/07/2016 Document Reviewed: 08/07/2016 Elsevier Interactive Patient Education  2017 [employer].  Artrosis Osteoarthritis La artrosis es un tipo de reumatismo articular que afecta el tejido que cubre los extremos de los huesos en las articulaciones (cartlago). El cartlago acta como amortiguador [employer] Corporation y los ayuda a moverse con suavidad. La artrosis se produce cuando el cartlago de las articulaciones se gasta. A veces, la artrosis se denomina reumatismo articular "por uso y desgaste". La artrosis es la forma ms frecuente de reumatismo articular. A menudo, afecta a las Smith International. Es una enfermedad que empeora con el tiempo (una enfermedad progresiva). Esta enfermedad afecta con ms frecuencia las articulaciones de:  Los dedos de [employer].  Los dedos [employer]aya.  Las caderas.  Las rodillas.  La columna vertebral, incluido el cuello y la zona lumbar.  Cules son las causas? Esta enfermedad es causada por el desgaste del cartlago que cubre los extremos de [place], lo cual tiene relacin con la edad. Qu incrementa el riesgo? Los siguientes factores pueden hacer que usted sea ms propenso a tener esta enfermedad:  Edad avanzada.  Tener exceso de Delphi u obesidad.  Uso excesivo de las articulaciones, como en el caso de los [place].  Lesin pasada de Risk analyst.  Ciruga pasada en  una articulacin.  Antecedentes familiares de artrosis.  Cules son los signos o los sntomas? Los principales sntomas de esta enfermedad son dolor, hinchazn y Geophysical data processor. Con el tiempo, la articulacin puede perder su forma. Se pueden desprender pequeos trozos de Dow Chemical o cartlago y flotar dentro de la articulacin, lo cual puede causar ms dolor y Clinical cytogeneticist. Pueden formarse pequeos depsitos de hueso (ostefitos) en los extremos de Merchant navy officer. Otros sntomas pueden incluir lo siguiente:  Una sensacin de chirrido o raspado dentro de la articulacin al moverla.  Sonidos de chasquido o crujido al [employer] Company.  Los sntomas pueden afectar una o ms articulaciones. La artrosis en una articulacin principal, como la rodilla o la cadera, puede causar dolor al caminar o al realizar ejercicio. Si tiene artrosis Jabil Circuit, es posible que no pueda agarrar objetos, torcer la mano o controlar pequeos movimientos de las manos y los dedos (motricidad fina). Cmo se diagnostica? Esta enfermedad se puede diagnosticar en funcin de lo siguiente:  Sus antecedentes mdicos.  Un examen fsico.  Sus sntomas.  Radiografas de la(s) articulacin(es) afectada(s).  Anlisis de sangre para descartar otros tipos de reumatismo articular.  Cmo se trata? No hay cura para esta enfermedad, pero el tratamiento puede ayudar a Human resources officer y Scientist, clinical (histocompatibility and immunogenetics) el funcionamiento de Nurse, learning disability. Los planes de tratamiento pueden incluir lo siguiente:  Un programa de ejercicios indicado que permita el descanso y el alivio de la articulacin. Puede trabajar con un fisioterapeuta.  Un plan de control del peso.  Tcnicas de Occidental Petroleum, como las siguientes: ? Aplicacin de calor y fro en la articulacin. ? Impulsos elctricos aplicados a las terminaciones nerviosas que se encuentran debajo de la piel (neuroestimulacin elctrica transcutnea [TENS]). ? Masajes. ? Ciertos suplementos nutricionales.  Antiiflamatorios no esteroideos o medicamentos recetados para ayudar a Engineer, materials.  Medicamentos para ayudar a Engineer, materials y la inflamacin (corticoesteroides). Estos se pueden administrar por boca (va oral) o mediante una inyeccin.  Dispositivos de [place], como un dispositivo ortopdico, una frula, un guante especial o un bastn.  Ciruga, como: ? Carolin Sicks. Se hace para reposicionar los huesos y Engineer, materials o para  retirar los trozos sueltos de hueso y TEFL teacher. ? Ciruga de reemplazo articular. Es posible que necesite esta ciruga si tiene una artrosis muy grave (avanzada).  Siga estas instrucciones en su casa: Actividad  Haga descansar las articulaciones afectadas segn las indicaciones del mdico.  No conduzca ni use maquinaria pesada mientras toma analgsicos recetados.  Practique los ejercicios que le indiquen. Es posible que el mdico o el fisioterapeuta le recomienden tipos especficos de ejercicios, tales como: ? Ejercicios de fortalecimiento. Se realizan para fortalecer los msculos que sostienen las articulaciones afectadas por el reumatismo. Pueden realizarse con peso o con bandas para agregar resistencia. ? Actividades Rhona Raider. Son ejercicios, como caminar a paso ligero o hacer gimnasia [place] acutica, que aumentan la actividad del corazn. ? Actividades de amplitud de movimientos. Facilitan el movimiento de las articulaciones. ? Ejercicios de equilibrio y [place]. Control del dolor, la rigidez y la hinchazn  Si se lo indican, aplique calor en la zona afectada tan frecuentemente como se lo haya indicado el mdico. Use la fuente de calor que el mdico le recomiende, como una compresa de calor hmedo o una almohadilla trmica. ? Si tiene un dispositivo de ayuda que se puede quitar, Smith International segn lo indicado por su mdico. ? Colquese una toalla entre la piel  y la fuente de Airline pilot. Si el mdico le indica que no se quite el dispositivo de USAA se Tax inspector, coloque una toalla entre el dispositivo de [place] y la fuente de Airline pilot. ? Aplique el calor durante 20 a . ? Retire la fuente de calor si la piel se le pone de color rojo brillante. Esto es muy importante si no puede sentir el dolor, el calor ni el fro. Puede correr un riesgo mayor de sufrir quemaduras.  Si se lo indican, aplique hielo sobre la articulacin afectada: ? Si tiene un dispositivo de ayuda que se  puede quitar, quteselo segn lo indicado por su mdico. ? Ponga el hielo en una bolsa plstica. ? Colquese una FirstEnergy Corp piel y la bolsa de hielo. Si el mdico le indica que no se quite el dispositivo de USAA se aplica hielo, coloque una toalla entre el dispositivo de [place] y la bolsa de hielo. ? Coloque el hielo durante , 2 o 3veces por da. Instrucciones generales  Baxter International de venta libre y los recetados solamente como se lo haya indicado el mdico.  Mantenga un peso saludable. Siga las instrucciones de su mdico con respecto al control del [place]. Estas pueden incluir restricciones en la dieta.  No consuma ningn producto que contenga nicotina o tabaco, como cigarrillos y Administrator, Civil Service. Estos pueden retrasar la consolidacin del [place]. Si necesita ayuda para dejar de fumar, consulte al American Express.  Use los dispositivos de American Express se lo haya indicado el mdico.  Concurra a todas las visitas de control como se lo haya indicado el mdico. Esto es importante. Dnde encontrar ms informacin:  Training and development officer de Reumatismo Articular y Event organiser Musculoesquelticas y Arboriculturist [employer] of Arthritis and Musculoskeletal and Skin Diseases): www.niams.http://www.myers.net/  Instituto [employer] el Envejecimiento ([employer] on Aging): https://walker.com/  Instituto Estadounidense de Advice worker of Rheumatology): www.rheumatology.org Comunquese con un mdico si:  La piel se pone roja.  Le aparece una erupcin cutnea.  Siente un dolor que [place].  Tiene fiebre y siente dolor en la articulacin o el msculo. Solicite ayuda de inmediato si:  [employer].  Pierde el apetito repentinamente.  Tiene sudoracin nocturna. Resumen  La artrosis es un tipo de reumatismo articular que afecta el tejido que cubre los extremos de los huesos en las articulaciones (cartlago).  Esta enfermedad es causada por  el desgaste del cartlago que cubre los extremos de [place], lo cual tiene relacin con la edad.  Los principales sntomas de esta enfermedad son dolor, hinchazn y Geophysical data processor.  No hay cura para esta enfermedad, pero el tratamiento puede ayudar a Human resources officer y Scientist, clinical (histocompatibility and immunogenetics) el funcionamiento de Nurse, learning disability. Esta informacin no tiene Theme park manager el consejo del mdico. Asegrese de hacerle al mdico cualquier pregunta que tenga. Document Released: 05/23/2005 Document Revised: 07/09/2016 Document Reviewed: 04/20/2013 Elsevier Interactive Patient Education  2018 [employer].  Dolor de rodilla (Knee Pain) El dolor de rodilla es un problema frecuente y puede tener muchas causas. A menudo desaparece si se siguen las instrucciones del mdico para el cuidado Facilities manager. El tratamiento del dolor continuo depender de su causa. Si el dolor persiste, tal vez haya que realizar ms estudios para Scientist, forensic, los cuales pueden incluir radiografas u otros estudios de diagnstico por imgenes de la rodilla. CUIDADOS EN EL HOGAR  Tome los medicamentos solamente como se lo haya indicado el mdico.  Mantenga la rodilla en reposo  y en alto (elevada) mientras est descansando.  No haga cosas que le causen dolor o que lo intensifiquen.  Evite las Ball Corporationactividades en las que ambos pies se separan del suelo al mismo tiempo, por ejemplo, correr, saltar la soga o hacer saltos de tijera.  Aplique hielo sobre la zona de la rodilla: ? Ponga el hielo en una bolsa plstica. ? Coloque una FirstEnergy Corptoalla entre la piel y la bolsa de hielo. ? Coloque el hielo durante 20 minutos, 2 a 3 veces por da.  Pregntele al mdico si debe usar una Neurosurgeonrodillera elstica.  Duerma con una almohada debajo de la rodilla.  Baje de peso si es necesario. El sobrepeso puede aumentar el dolor de rodilla.  No consuma ningn producto que contenga tabaco, lo que incluye cigarrillos, tabaco de Theatre managermascar o  Administrator, Civil Servicecigarrillos electrnicos. Si necesita ayuda para dejar de fumar, consulte al mdico. Fumar puede retrasar la curacin de cualquier problema que tenga en el hueso y Nurse, learning disabilityla articulacin.  SOLICITE AYUDA SI:  El dolor de rodilla no desaparece, cambia o empeora.  Tiene fiebre junto con dolor de rodilla.  La rodilla le falla o se le queda trabada.  La rodilla est ms hinchada.  SOLICITE AYUDA DE INMEDIATO SI:  La rodilla est caliente al tacto.  Tiene dolor en el pecho o dificultad para respirar.  Esta informacin no tiene Theme park managercomo fin reemplazar el consejo del mdico. Asegrese de hacerle al mdico cualquier pregunta que tenga. Document Released: 03/10/2014 Document Revised: 03/10/2014 Document Reviewed: 10/14/2013 Elsevier Interactive Patient Education  2017 [employer].

## 2017-10-07 NOTE — Progress Notes (Signed)
Assessment & Plan:  Diontre was seen today for follow-up and knee pain.  Diagnoses and all orders for this visit:  Chronic right-sided thoracic back pain  Acute pain of right knee  Immunization due -     Flu Vaccine QUAD 6+ mos PF IM (Fluarix Quad PF)  Other orders -     meloxicam (MOBIC) 15 MG tablet; Take 1 tablet (15 mg total) by mouth daily. -     diclofenac sodium (VOLTAREN) 1 % GEL; Apply 2 g topically 4 (four) times daily.    Patient has been counseled on age-appropriate routine health concerns for screening and prevention. These are reviewed and up-to-date. Referrals have been placed accordingly. Immunizations are up-to-date or declined.    Subjective:   Chief Complaint  Patient presents with  . Follow-up    Patient is here for a follow-up. Patient stated he still have back pain.   . Knee Pain    Patient have pain on both of his knees and his right mainly hurts for 8 days.Patient stated when he walks and sit down it hurts.    HPI Donald Briggs 42 y.o. male presents to office today for follow up to back pain. He also has right knee pain with onset 8 days ago. His wife is accompanying and interpreting for him today. His abdominal pain has resolved.   Back Pain He continues to have thoracic back pain. He was prescribed prednisone at his last office visit. He endorses significant relief of his back pain with taking prednisone. Will try mobic. If unsuccessful with pain relief will order PT. He is continues to not wear a back brace as instructed.  Imaging was performed on 09-04-2016 Xray of Thoracic Spine IMPRESSION: 1. Bulky chronic right lateral endplate osteophytes in the midthoracic spine, maximal at T7-T8. Suspect some associated right costovertebral joint degeneration also. 2. No acute osseous abnormality identified and preserved thoracic disc spaces. 3. Partially visible cervical and lumbar disc and endplate degeneration.   Right Knee Pain Onset 8 days ago.  Pain is described as sharp and aching. He denies any trauma, injury or swelling. Aggravating factors: bending, squatting. Relieving factors: rest. He has not tried any medication for the pain.    Review of Systems  Constitutional: Negative for fever, malaise/fatigue and weight loss.  Eyes: Negative.  Negative for blurred vision, double vision and photophobia.  Respiratory: Negative.  Negative for cough and shortness of breath.   Cardiovascular: Negative.  Negative for chest pain, palpitations and leg swelling.  Gastrointestinal: Negative for abdominal pain.  Musculoskeletal: Positive for back pain and joint pain. Negative for myalgias.       See HPI  Neurological: Negative.  Negative for dizziness, focal weakness, seizures and headaches.  Psychiatric/Behavioral: Negative.  Negative for depression and suicidal ideas.    History reviewed. No pertinent past medical history.  History reviewed. No pertinent surgical history.  Family History  Problem Relation Age of Onset  . Cancer Mother     Social History Reviewed with no changes to be made today.   Outpatient Medications Prior to Visit  Medication Sig Dispense Refill  . pantoprazole (PROTONIX) 20 MG tablet Take 1 tablet (20 mg total) by mouth daily. (Patient not taking: Reported on 09/02/2017) 30 tablet 0  . predniSONE (DELTASONE) 20 MG tablet Take 3 tablets on days 1-2; Take 2 tablets on days 3-4; Take 1 tablet on days 5-6; take 1/2 tablet on days 7-8; then stop. (Patient not taking: Reported on 10/07/2017) 13 tablet  0   No facility-administered medications prior to visit.     No Known Allergies     Objective:    BP 98/61 (BP Location: Right Arm, Patient Position: Sitting, Cuff Size: Normal)   Pulse 76   Temp 98.3 F (36.8 C) (Oral)   Ht 5\' 5"  (1.651 m)   Wt 133 lb 12.8 oz (60.7 kg)   SpO2 94%   BMI 22.27 kg/m  Wt Readings from Last 3 Encounters:  10/07/17 133 lb 12.8 oz (60.7 kg)  09/02/17 135 lb (61.2 kg)  08/09/17  137 lb 9.6 oz (62.4 kg)    Physical Exam  Constitutional: He is oriented to person, place, and time. He appears well-developed and well-nourished.  HENT:  Head: Normocephalic.  Cardiovascular: Normal rate, regular rhythm and normal heart sounds.  No murmur heard. Pulmonary/Chest: Effort normal and breath sounds normal. No respiratory distress. He has no wheezes. He has no rales. He exhibits no tenderness.  Abdominal: Soft. Bowel sounds are normal.  Musculoskeletal: Normal range of motion. He exhibits no edema, tenderness or deformity.       Right knee: He exhibits normal range of motion, no swelling, no deformity and no bony tenderness.       Thoracic back: He exhibits pain (with firm pressure applied to right thoracic area). He exhibits normal range of motion, no bony tenderness, no swelling and no edema.  Neurological: He is alert and oriented to person, place, and time.  Skin: Skin is warm and dry.  Psychiatric: He has a normal mood and affect. His speech is normal and behavior is normal. Judgment and thought content normal.       Patient has been counseled extensively about nutrition and exercise as well as the importance of adherence with medications and regular follow-up. The patient was given clear instructions to go to ER or return to medical center if symptoms don't improve, worsen or new problems develop. The patient verbalized understanding.   Follow-up: Return in about 3 weeks (around 10/28/2017) for back pain.   Claiborne RiggZelda W Fleming, FNP-BC [facility] [place], [place] [phone]   10/07/2017, 12:13 PM

## 2017-10-30 ENCOUNTER — Ambulatory Visit: Payer: Self-pay | Admitting: Nurse Practitioner

## 2018-02-20 ENCOUNTER — Other Ambulatory Visit: Payer: Self-pay | Admitting: Nurse Practitioner

## 2018-02-20 DIAGNOSIS — G8929 Other chronic pain: Secondary | ICD-10-CM

## 2018-02-20 DIAGNOSIS — M25561 Pain in right knee: Secondary | ICD-10-CM

## 2018-02-20 DIAGNOSIS — M546 Pain in thoracic spine: Principal | ICD-10-CM

## 2018-02-21 MED FILL — MELOXICAM 15 MG TABLET: 15 | 30 days supply | Qty: 30 | Fill #0

## 2018-02-21 NOTE — Telephone Encounter (Signed)
Patient called requesting a refill on meloxicam (MOBIC) 15 MG tablet [facility] Please f/u

## 2018-03-28 ENCOUNTER — Other Ambulatory Visit: Payer: Self-pay | Admitting: Nurse Practitioner

## 2018-03-28 DIAGNOSIS — G8929 Other chronic pain: Secondary | ICD-10-CM

## 2018-03-28 DIAGNOSIS — M25561 Pain in right knee: Secondary | ICD-10-CM

## 2018-03-28 DIAGNOSIS — M546 Pain in thoracic spine: Principal | ICD-10-CM

## 2018-03-28 MED FILL — MELOXICAM 15 MG TABLET: 15 | 30 days supply | Qty: 30 | Fill #0

## 2018-03-28 MED FILL — DICLOFENAC SODIUM 1% GEL: 1 | 12 days supply | Qty: 100 | Fill #1

## 2018-05-14 MED FILL — MELOXICAM 15 MG TABLET: 15 | 30 days supply | Qty: 30 | Fill #1

## 2018-07-07 IMAGING — DX DG THORACIC SPINE 2V
3 series · 3 of 3 positions shown · non-contrast
Comparison: Chest radiographs 12/24/2013.

CLINICAL DATA: 41-year-old male with chronic right side thoracic
back pain. Symptoms aggravated with movement.

EXAM:
THORACIC SPINE 2 VIEWS

[t-spine ap]
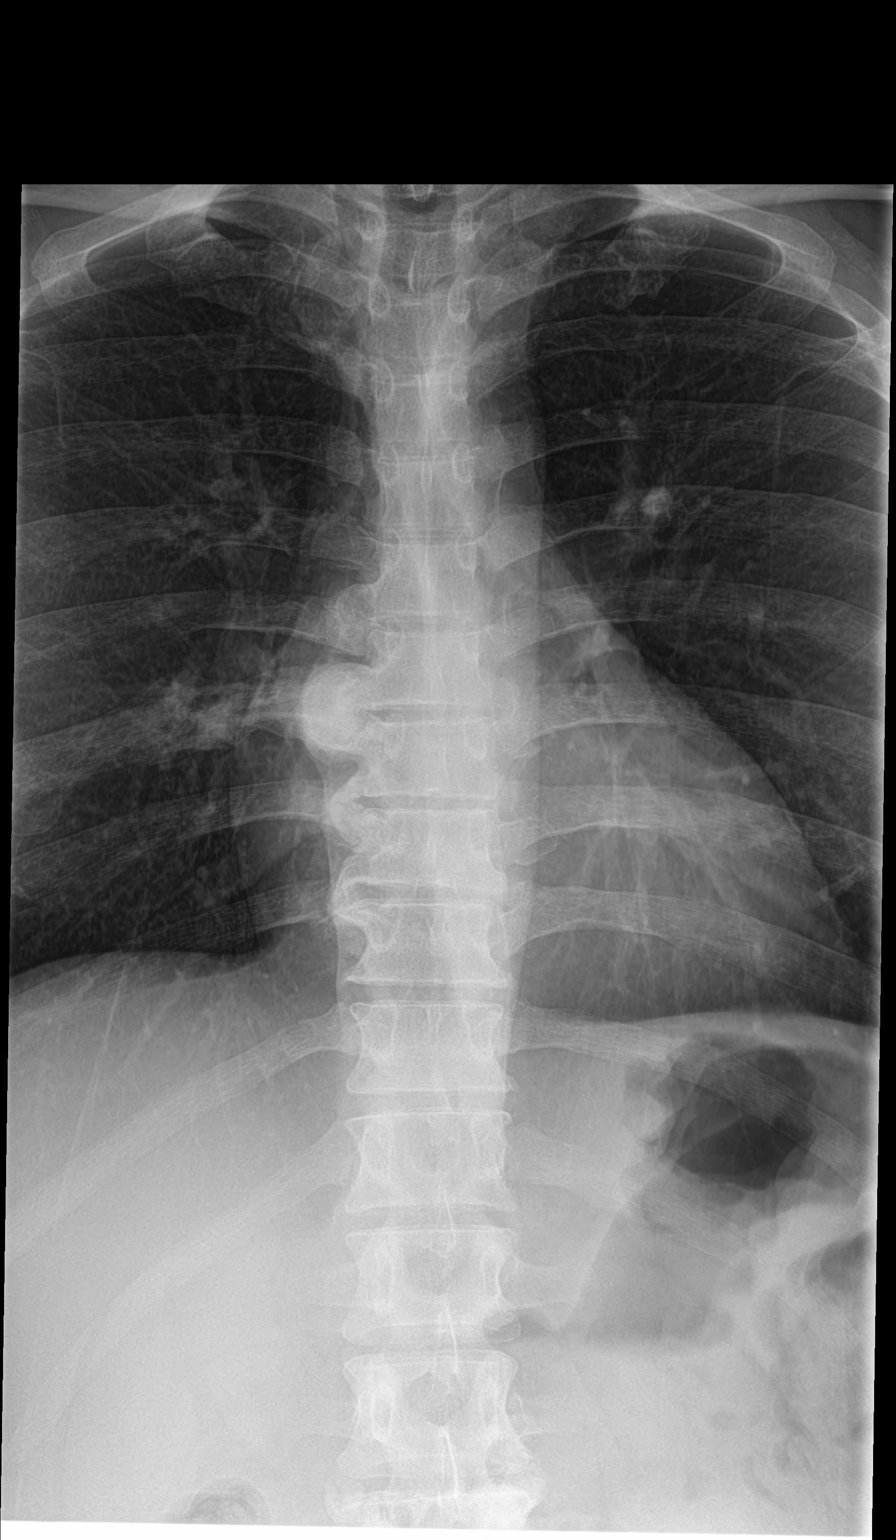

[t-spine lat]
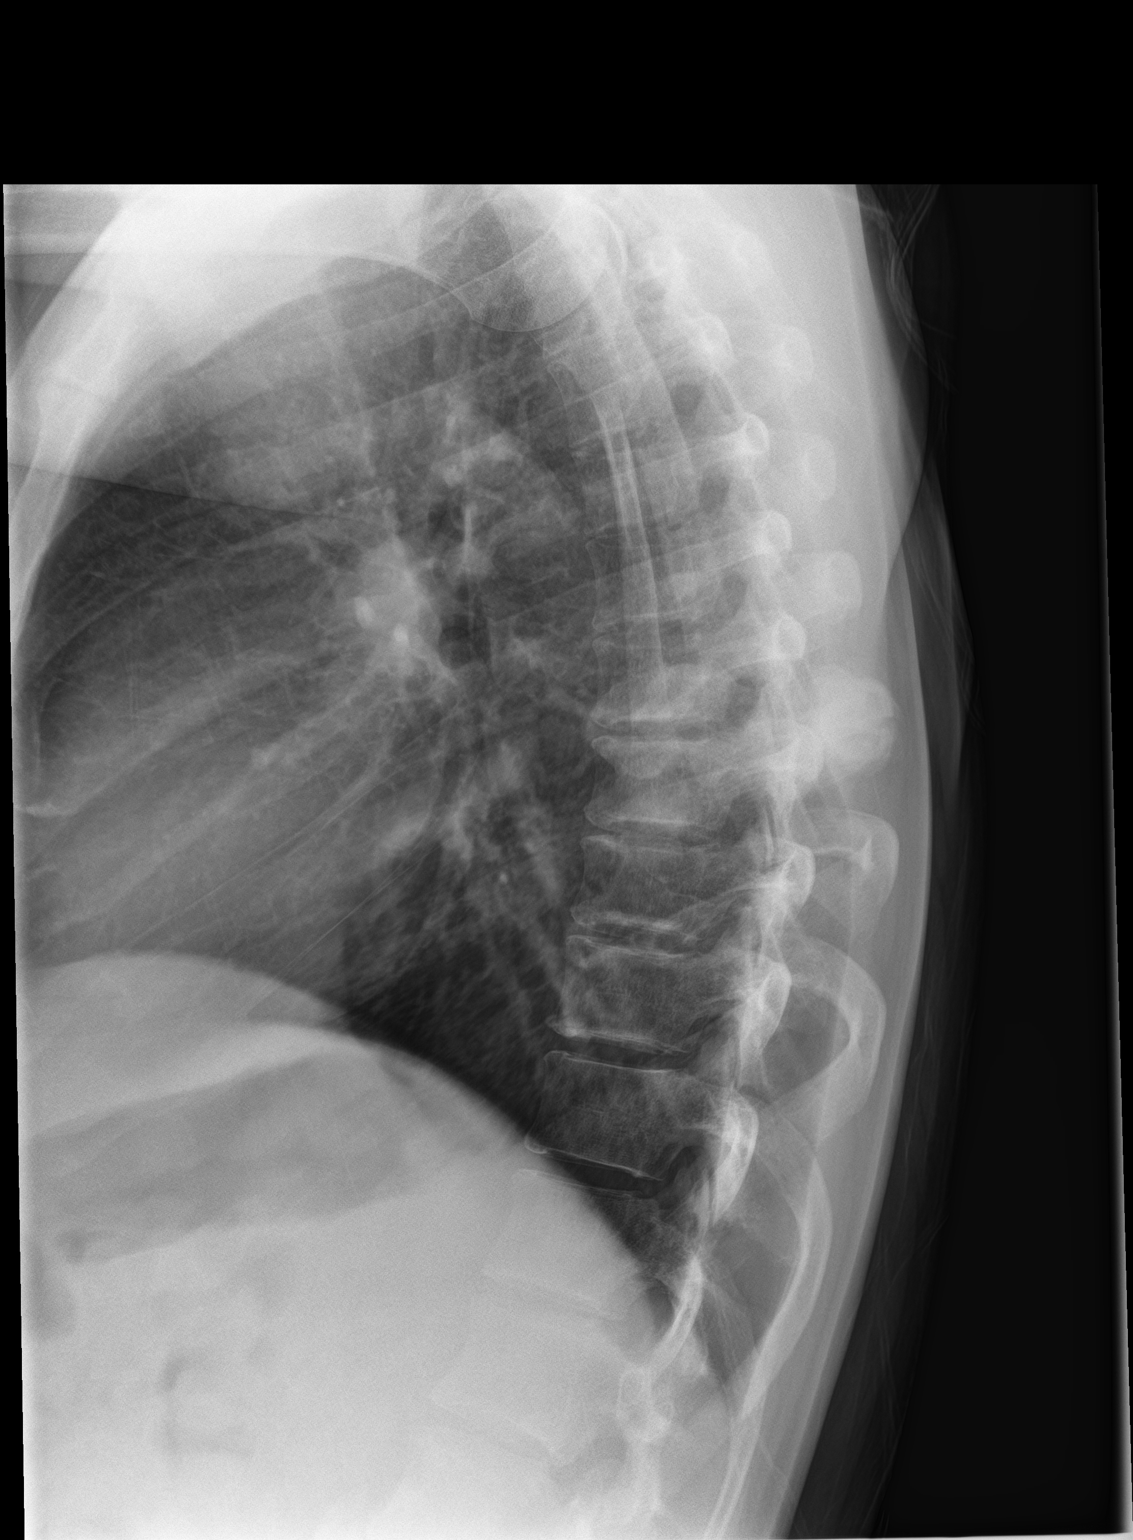

[t-spine swimmers]
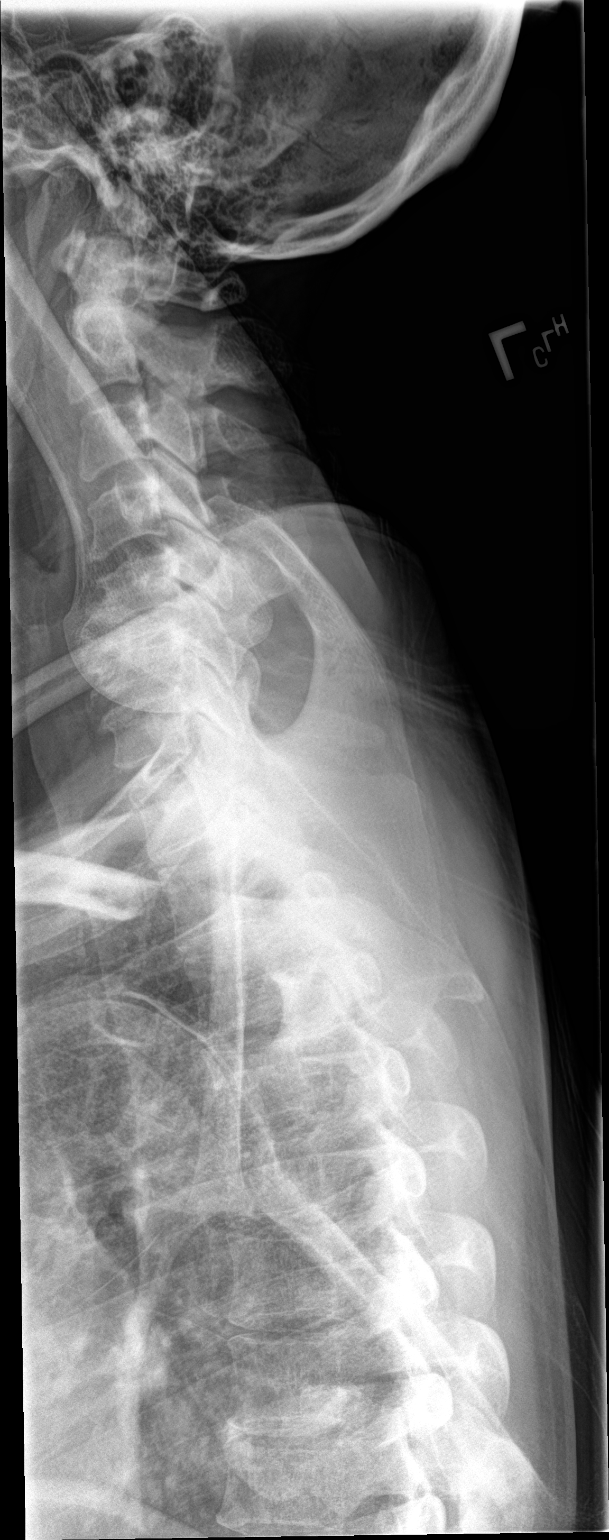

[3 of 3 positions shown; findings below may reference images not displayed]

FINDINGS: Normal thoracic segmentation. Bone mineralization is within normal
limits. Preserved thoracic kyphosis. Cervicothoracic junction
alignment is within normal limits. Preserved thoracic disc spaces.
Chronic flowing right lateral thoracic endplate osteophytes from the
T6 to the T10 level, bulky at T7-T8. Associated right T6 and T7
costovertebral bony hypertrophy also. Posterior ribs otherwise
appear normal.

Lower cervical chronic disc and endplate degeneration. Negative
visible upper lumbar levels except for some disc space loss and
endplate degeneration at L2-L3. Visible thoracic and abdominal
visceral contours appear stable and within normal limits
IMPRESSION: 1. Bulky chronic right lateral endplate osteophytes in the
midthoracic spine, maximal at T7-T8. Suspect some associated right
costovertebral joint degeneration also.
2. No acute osseous abnormality identified and preserved thoracic
disc spaces.
3. Partially visible cervical and lumbar disc and endplate
degeneration.

## 2018-09-02 ENCOUNTER — Emergency Department (HOSPITAL_COMMUNITY)
Admission: EM | Admit: 2018-09-02 | Discharge: 2018-09-02 | Disposition: A | Discharge status: Home / Self Care | Payer: Self-pay | Attending: Emergency Medicine | Admitting: Emergency Medicine

## 2018-09-02 ENCOUNTER — Encounter (HOSPITAL_COMMUNITY): Payer: Self-pay | Admitting: Emergency Medicine

## 2018-09-02 ENCOUNTER — Emergency Department (HOSPITAL_COMMUNITY): Payer: Self-pay

## 2018-09-02 ENCOUNTER — Other Ambulatory Visit: Payer: Self-pay

## 2018-09-02 DIAGNOSIS — G510 Bell's palsy: Secondary | ICD-10-CM | POA: Insufficient documentation

## 2018-09-02 DIAGNOSIS — Z79899 Other long term (current) drug therapy: Secondary | ICD-10-CM | POA: Insufficient documentation

## 2018-09-02 DIAGNOSIS — F17200 Nicotine dependence, unspecified, uncomplicated: Secondary | ICD-10-CM | POA: Insufficient documentation

## 2018-09-02 MED ORDER — HYPROMELLOSE (GONIOSCOPIC) 2.5 % OP SOLN: 1.0000 [drp] | Freq: Four times a day (QID) | OPHTHALMIC | 12 refills | Status: DC | PRN

## 2018-09-02 NOTE — ED Provider Notes (Signed)
[facility] Provider Note   CSN: [id number] Arrival date & time: 09/02/18  1243     History   Chief Complaint No chief complaint on file.   HPI Donald Briggs is a 43 y.o. male.  HPI Donald Briggs is a 43 y.o. male presents to emergency department with complaint of right facial drooping.  Patient noticed drooping yesterday.  He states he is having difficulty closing his eye and chewing.  He states he is unable to smile with the right side of the face.  He denies any head trauma.  No fever, chills, URI symptoms.  No headache.  He has not tried any treatment.  No history of the same.  Nothing making his symptoms better or worse.  Patient is coming from jail.  No past medical history on file.  Patient Active Problem List   Diagnosis Date Noted  . Tobacco dependence 08/14/2017    No past surgical history on file.      Home Medications    Prior to Admission medications   Medication Sig Start Date End Date Taking? Authorizing Provider  meloxicam (MOBIC) 15 MG tablet TAKE 1 TABLET (15 MG TOTAL) BY MOUTH DAILY. 03/28/18   Claiborne RiggFleming, Zelda W, NP  pantoprazole (PROTONIX) 20 MG tablet Take 1 tablet (20 mg total) by mouth daily. Patient not taking: Reported on 09/02/2017 12/24/13   Hess, Nada Boozerobyn M, PA-C  predniSONE (DELTASONE) 20 MG tablet Take 3 tablets on days 1-2; Take 2 tablets on days 3-4; Take 1 tablet on days 5-6; take 1/2 tablet on days 7-8; then stop. Patient not taking: Reported on 10/07/2017 09/02/17   Claiborne RiggFleming, Zelda W, NP    Family History Family History  Problem Relation Age of Onset  . Cancer Mother     Social History Social History   Tobacco Use  . Smoking status: Current Every Day Smoker  . Smokeless tobacco: Never Used  . Tobacco comment: 2 cigarettes per day   Substance Use Topics  . Alcohol use: Yes  . Drug use: No     Allergies   Patient has no known allergies.   Review of Systems Review of Systems    Constitutional: Negative for chills and fever.  HENT: Negative for postnasal drip.   Respiratory: Negative for cough, chest tightness and shortness of breath.   Cardiovascular: Negative for chest pain, palpitations and leg swelling.  Gastrointestinal: Negative for abdominal distention, abdominal pain, diarrhea, nausea and vomiting.  Genitourinary: Negative for dysuria, frequency, hematuria and urgency.  Musculoskeletal: Negative for arthralgias, myalgias, neck pain and neck stiffness.  Skin: Negative for rash.  Allergic/Immunologic: Negative for immunocompromised state.  Neurological: Positive for facial asymmetry and weakness. Negative for dizziness, light-headedness, numbness and headaches.     Physical Exam Updated Vital Signs There were no vitals taken for this visit.  Physical Exam Vitals signs and nursing note reviewed.  Constitutional:      General: He is not in acute distress.    Appearance: He is well-developed.  HENT:     Head: Normocephalic and atraumatic.  Eyes:     Conjunctiva/sclera: Conjunctivae normal.  Neck:     Musculoskeletal: Neck supple.  Cardiovascular:     Rate and Rhythm: Normal rate and regular rhythm.     Heart sounds: Normal heart sounds.  Pulmonary:     Effort: Pulmonary effort is normal. No respiratory distress.     Breath sounds: No wheezing or rales.  Abdominal:     General: Bowel sounds  are normal. There is no distension.     Palpations: Abdomen is soft.     Tenderness: There is no abdominal tenderness. There is no rebound.  Skin:    General: Skin is warm and dry.  Neurological:     Mental Status: He is alert.     Comments: Right facial droop, patient unable to close his right eye completely, unable to raise his right eyebrow, drooping of the right corner of the mouth.  Rest of the cranial nerves intact.  5 out of 5 and equal strength of upper and lower extremities bilaterally.  Gait is normal the patient is wearing handcuffs and leg cuffs   Psychiatric:        Mood and Affect: Mood normal.      ED Treatments / Results  Labs (all labs ordered are listed, but only abnormal results are displayed) Labs Reviewed - No data to display  EKG None  Radiology Ct Head Wo Contrast  Result Date: 09/02/2018 CLINICAL DATA:  43 y/o M; right-sided facial droop and inability to close the right eye. EXAM: CT HEAD WITHOUT CONTRAST TECHNIQUE: Contiguous axial images were obtained from the base of the skull through the vertex without intravenous contrast. COMPARISON:  02/09/2015 CT head. FINDINGS: Brain: No evidence of acute infarction, hemorrhage, hydrocephalus, extra-axial collection or mass lesion/mass effect. Vascular: No hyperdense vessel or unexpected calcification. Skull: Normal. Negative for fracture or focal lesion. Sinuses/Orbits: Small left maxillary sinus mucous retention cyst. Additional visible paranasal sinuses and the mastoid air cells are normally aerated. Other: None. IMPRESSION: No acute intracranial abnormality identified. Stable unremarkable CT of the head. Electronically Signed   By: Mitzi HansenLance  Furusawa-Stratton M.D.   On: 09/02/2018 15:25    Procedures Procedures (including critical care time)  Medications Ordered in ED Medications - No data to display   Initial Impression / Assessment and Plan / ED Course  I have reviewed the triage vital signs and the nursing notes.  Pertinent labs & imaging results that were available during my care of the patient were reviewed by me and considered in my medical decision making (see chart for details).     12:56 PM Patient with right facial drooping since yesterday.  He is right facial droop does include right eye and right forehead.  This is most likely a peripheral lesion, most likely Bell's palsy and otherwise healthy 43 year old.  He does not have any risk factors for a stroke and denies any head trauma.  Discussed with Dr.Zammit, we will image his brain due to first time onset of  this issue.  3:43 PM CT scan negative.  Discussed results using interpreter pad and plan.  I will prescribe lubricating eyedrops and discussed how to close his eye shut at bedtime.  I will have him follow-up with family doctor.  Vitals:   09/02/18 1258 09/02/18 1259  BP: 110/67   Pulse: (!) 48   Resp: 16   Temp: 98.1 F (36.7 C)   TempSrc: Oral   SpO2: 99%   Weight:  63.5 kg  Height:  5\' 4"  (1.626 m)     Final Clinical Impressions(s) / ED Diagnoses   Final diagnoses:  Bell palsy    ED Discharge Orders         Ordered    hydroxypropyl methylcellulose / hypromellose (ISOPTO TEARS / GONIOVISC) 2.5 % ophthalmic solution  4 times daily PRN     09/02/18 1542           Ojas Coone, Robinsonatyana, PA-C 09/02/18  1544    Bethann Berkshire, MD 09/03/18 [phone]

## 2018-09-02 NOTE — ED Notes (Signed)
Patient verbalizes understanding of discharge instructions. Opportunity for questioning and answers were provided. Armband removed by staff, pt discharged from ED ambulatory to [facility].

## 2018-09-02 NOTE — ED Triage Notes (Signed)
Arrived via [facility]. Patient speaks Spanish used language interpreter by nurse and PA. Patient in jail and developed right side facial droop and inability to completely close right eye one day ago. Continued today and denies pain or trauma. Alert answering and following commands appropriate.

## 2018-09-02 NOTE — Discharge Instructions (Signed)
Lubricating eye drops 4 times a day as needed. Patch eye shut at bed time. Follow up with your doctor as needed.

## 2018-09-30 ENCOUNTER — Ambulatory Visit: Payer: Self-pay | Attending: Nurse Practitioner | Admitting: Nurse Practitioner

## 2018-09-30 ENCOUNTER — Encounter: Payer: Self-pay | Admitting: Nurse Practitioner

## 2018-09-30 VITALS — BP 113/75 | HR 73 | Temp 98.6°F | Ht 65.0 in | Wt 155.4 lb

## 2018-09-30 DIAGNOSIS — G510 Bell's palsy: Secondary | ICD-10-CM

## 2018-09-30 DIAGNOSIS — G8929 Other chronic pain: Secondary | ICD-10-CM

## 2018-09-30 DIAGNOSIS — M546 Pain in thoracic spine: Secondary | ICD-10-CM

## 2018-09-30 MED ORDER — MELOXICAM 15 MG PO TABS
15.0000 mg | ORAL_TABLET | Freq: Every day | ORAL | 3 refills | Status: DC
Start: 2018-09-30 — End: 2018-11-11

## 2018-09-30 MED ORDER — ASPIRIN-ACETAMINOPHEN-CAFFEINE 250-250-65 MG PO TABS: 1.0000 | ORAL_TABLET | Freq: Four times a day (QID) | ORAL | 1 refills | Status: AC | PRN

## 2018-09-30 MED ORDER — CYCLOBENZAPRINE HCL 10 MG PO TABS: 10.0000 mg | ORAL_TABLET | Freq: Three times a day (TID) | ORAL | 0 refills | Status: DC | PRN

## 2018-09-30 MED ORDER — HYPROMELLOSE (GONIOSCOPIC) 2.5 % OP SOLN: 1.0000 [drp] | Freq: Four times a day (QID) | OPHTHALMIC | 12 refills | Status: DC | PRN

## 2018-09-30 MED FILL — CYCLOBENZAPRINE 10 MG TAB: 10 | 20 days supply | Qty: 60 | Fill #0

## 2018-09-30 MED FILL — MELOXICAM 15 MG TABLET: 15 | 30 days supply | Qty: 30 | Fill #0

## 2018-09-30 NOTE — Patient Instructions (Signed)
Parlisis facial en los adultos  Bell Palsy, Adult    La parlisis facial es una incapacidad a corto plazo para mover los msculos de una parte del rostro. La incapacidad del movimiento (parlisis) es el resultado de la inflamacin o compresin del nervio facial, que recorre el crneo y la parte inferior de las orejas hacia el costado del rostro (sptimo nervio craneal). Este nervio es responsable de los movimientos faciales, que incluyen parpadear, cerrar los ojos, sonrer y fruncir el ceo.  Cules son las causas?  Se desconoce la causa exacta de esta afeccin. Puede ser causada por una infeccin de un virus, como el virus de la varicela (herpes zster), de Epstein-Barr o de las paperas.  Qu incrementa el riesgo?  Es ms probable que usted sufra esta afeccin si:   Est embarazada.   Tiene diabetes.   Recientemente ha tenido una infeccin en la nariz, la garganta o las vas respiratorias (infeccin de las vas respiratorias superiores).   Tiene debilitado el sistema de defensa del organismo (sistema inmunitario).   Tuvo una lesin facial, como una fractura.   Tiene antecedentes familiares de parlisis facial.  Cules son los signos o los sntomas?  Los sntomas de esta afeccin incluyen los siguientes:   Debilidad en un lado del rostro.   Cada del prpado y de la comisura de la boca.   Exceso de lagrimeo en uno de los ojos.   Dificultad para cerrar el prpado.   Ojo seco.   Babeo.   Sequedad en la boca.   Cambios en el sentido del gusto.   Cambio en el aspecto facial.   Dolor detrs de una oreja.   Zumbido en una o ambas orejas.   Sensibilidad al ruido en una oreja.   Contraccin facial.   Dolor de cabeza.   Trastornos del habla.   Mareos.   Dificultad para comer o beber.  La mayor parte del tiempo, solo una parte del rostro se ve afectada. Rara vez, la parlisis facial afecta todo el rostro.  Cmo se diagnostica?  Esta afeccin se diagnostica en funcin de lo siguiente:   Sus  sntomas.   Sus antecedentes mdicos.   Un examen fsico.  Es posible que, adems, deba consultar a un especialista en trastornos de los nervios (neurlogo) o enfermedades y afecciones oculares (oftalmlogo). Pueden hacerle estudios, por ejemplo:   Una prueba para controlar si hubo dao nervioso (electromiograma).   Estudios de diagnstico por imgenes, como exploracin por tomografa computarizada (TC) o resonancia magntica (RM).   Anlisis de sangre.  Cmo se trata?  Esta afeccin afecta a cada persona de un modo diferente. A veces, los sntomas desaparecen sin tratamiento en el plazo de unas semanas. Si el tratamiento es necesario, vara de persona a persona. El objetivo del tratamiento es reducir la inflamacin y proteger los ojos para que no se daen. El tratamiento de la parlisis facial puede incluir:   Medicamentos, por ejemplo:  ? Corticoesteroides para reducir la hinchazn y la inflamacin.  ? Medicamentos antivirales.  ? Analgsicos, como aspirina, paracetamol o ibuprofeno.   Gotas oftlmicas o ungento para mantener los ojos humectados.   Proteccin para los ojos, si no puede cerrar el ojo.   Ejercicios o masajes para recuperar la fuerza y la funcin del msculo (fisioterapia).  Siga estas indicaciones en su casa:     Tome los medicamentos de venta libre y los recetados solamente como se lo haya indicado el mdico.   Si el ojo est afectado:  ?   Aplique el ungento o las gotas oftlmicas para mantener los ojos humectados tal como se lo haya indicado el mdico.  ? Siga las indicaciones del mdico para el cuidado y la proteccin de los ojos como se lo haya indicado el mdico.   Haga los ejercicios de fisioterapia como se lo haya indicado el mdico.   Concurra a todas las visitas de control como se lo haya indicado el mdico. Esto es importante.  Comunquese con un mdico si:   Tiene fiebre.   Los sntomas no mejoran en un lapso de 2 a 3semanas, o empeoran.   El ojo est rojo, irritado o  le duele.   Aparecen nuevos sntomas.  Solicite ayuda de inmediato si:   Siente debilidad o adormecimiento en alguna parte del cuerpo que no sea el rostro.   Tiene dificultad para tragar.   Tiene dolor o rigidez en el cuello.   Tiene mareos o le falta el aire.  Resumen   La parlisis facial es una incapacidad a corto plazo para mover los msculos de una parte del rostro. La incapacidad para moverse (parlisis) es el resultado de la inflamacin o la compresin del nervio facial.   Esta afeccin afecta a cada persona de un modo diferente. A veces, los sntomas desaparecen sin tratamiento en el plazo de unas semanas.   Si el tratamiento es necesario, vara de persona a persona. El objetivo del tratamiento es reducir la inflamacin y proteger los ojos para que no se daen.   Comunquese con el mdico si los sntomas no mejoran en el lapso de 2 a 3semanas, o empeoran.  Esta informacin no tiene como fin reemplazar el consejo del mdico. Asegrese de hacerle al mdico cualquier pregunta que tenga.  Document Released: 08/13/2005 Document Revised: 09/04/2017 Document Reviewed: 03/04/2017  Elsevier Interactive Patient Education  2019 [employer] Inc[employer]

## 2018-09-30 NOTE — Progress Notes (Signed)
Assessment & Plan:  Donald Briggs was seen today for hospitalization follow-up.  Diagnoses and all orders for this visit:  Bell's palsy -     aspirin-acetaminophen-caffeine (EXCEDRIN MIGRAINE) 250-250-65 MG tablet; Take 1-2 tablets by mouth every 6 (six) hours as needed for up to 30 days for headache. -     hydroxypropyl methylcellulose / hypromellose (ISOPTO TEARS / GONIOVISC) 2.5 % ophthalmic solution; Place 1 drop into the right eye 4 (four) times daily as needed for dry eyes.  Chronic right-sided thoracic back pain -     meloxicam (MOBIC) 15 MG tablet; Take 1 tablet (15 mg total) by mouth daily. -     cyclobenzaprine (FLEXERIL) 10 MG tablet; Take 1 tablet (10 mg total) by mouth 3 (three) times daily as needed for muscle spasms.  May alternate with heat and ice application for pain relief. May also alternate with acetaminophen and Ibuprofen as prescribed for back pain. Other alternatives include massage, acupuncture and water aerobics.  You must stay active and avoid a sedentary lifestyle.    Patient has been counseled on age-appropriate routine health concerns for screening and prevention. These are reviewed and up-to-date. Referrals have been placed accordingly. Immunizations are up-to-date or declined.    Subjective:   Chief Complaint  Patient presents with  . Hospitalization Follow-up    Pt. is here for HFU for facial paralysis.    HPI Donald Briggs 43 y.o. male presents to office today for follow up to bells palsy.    Bells Palsy Patient seen in the ED on 09-02-2018 with complaints of acute onset of right facial drooping with inability to smile, close his eye or chew on the right side. He denied any head trauma or injury and was diagnosed with Bells Palsy and treated with lubricating eyedrops and tapering dose of prednisone.  Today he continues with complaints of dryness of the right eye. Will refill eye drops. He has questions regarding how long his symptoms will last. He is also  endorsing headaches of mild to moderate severity which are not new. I have instructed him If no improvement at next office visit will need repeat neuro imaging.  Head CT in the ED was negative for CVA. He currently denies any stroke like symptoms.   Thoracic Back Pain Chronic and improved. Relieving factors: flexeril and mobic. He does not wear a back brace which has been recommended on several office visits. Pain occurs in the thoracic and lumbar spine which radiates to right midaxiallary region (aching, sharp and stabbing in character; 8/10 in severity).  Initial inciting event: none. Symptoms are worst: afternoon after he is finished working. Alleviating factors identifiable by patient are rest.. Exacerbating factors identifiable by patient are heavy lifting, coughing, movement. Imaging 09-04-2016 Xray of Thoracic Spine IMPRESSION: 1. Bulky chronic right lateral endplate osteophytes in the midthoracic spine, maximal at T7-T8. Suspect some associated right costovertebral joint degeneration also. 2. No acute osseous abnormality identified and preserved thoracic disc spaces. 3. Partially visible cervical and lumbar disc and endplate degeneration.  Review of Systems  Constitutional: Negative for fever, malaise/fatigue and weight loss.  HENT: Negative.  Negative for nosebleeds.   Eyes: Negative for blurred vision, double vision and photophobia.       SEE HPI  Respiratory: Negative.  Negative for cough and shortness of breath.   Cardiovascular: Negative.  Negative for chest pain, palpitations and leg swelling.  Gastrointestinal: Negative.  Negative for heartburn, nausea and vomiting.  Musculoskeletal: Positive for back pain and joint pain.  Negative for myalgias.  Neurological: Positive for sensory change. Negative for dizziness, focal weakness, seizures and headaches.  Psychiatric/Behavioral: Negative.  Negative for suicidal ideas.    Past Medical History:  Diagnosis Date  . Back pain   .  Bell's palsy     History reviewed. No pertinent surgical history.  Family History  Problem Relation Age of Onset  . Cancer Mother     Social History Reviewed with no changes to be made today.   Outpatient Medications Prior to Visit  Medication Sig Dispense Refill  . hydroxypropyl methylcellulose / hypromellose (ISOPTO TEARS / GONIOVISC) 2.5 % ophthalmic solution Place 1 drop into the right eye 4 (four) times daily as needed for dry eyes. 15 mL 12  . pantoprazole (PROTONIX) 20 MG tablet Take 1 tablet (20 mg total) by mouth daily. (Patient not taking: Reported on 09/02/2017) 30 tablet 0  . meloxicam (MOBIC) 15 MG tablet TAKE 1 TABLET (15 MG TOTAL) BY MOUTH DAILY. (Patient not taking: Reported on 09/30/2018) 30 tablet 3  . predniSONE (DELTASONE) 20 MG tablet Take 3 tablets on days 1-2; Take 2 tablets on days 3-4; Take 1 tablet on days 5-6; take 1/2 tablet on days 7-8; then stop. (Patient not taking: Reported on 10/07/2017) 13 tablet 0   No facility-administered medications prior to visit.     No Known Allergies     Objective:    BP 113/75 (BP Location: Right Arm, Patient Position: Sitting, Cuff Size: Normal)   Pulse 73   Temp 98.6 F (37 C) (Oral)   Ht 5\' 5"  (1.651 m)   Wt 155 lb 6.4 oz (70.5 kg)   SpO2 96%   BMI 25.86 kg/m  Wt Readings from Last 3 Encounters:  09/30/18 155 lb 6.4 oz (70.5 kg)  09/02/18 140 lb (63.5 kg)  10/07/17 133 lb 12.8 oz (60.7 kg)    Physical Exam Vitals signs and nursing note reviewed.  Constitutional:      Appearance: He is well-developed.  HENT:     Head: Normocephalic and atraumatic.  Neck:     Musculoskeletal: Normal range of motion.  Cardiovascular:     Rate and Rhythm: Normal rate and regular rhythm.     Heart sounds: Normal heart sounds. No murmur. No friction rub. No gallop.   Pulmonary:     Effort: Pulmonary effort is normal. No tachypnea or respiratory distress.     Breath sounds: Normal breath sounds. No decreased breath sounds,  wheezing, rhonchi or rales.  Chest:     Chest wall: No tenderness.  Abdominal:     General: Bowel sounds are normal.     Palpations: Abdomen is soft.  Musculoskeletal: Normal range of motion.  Skin:    General: Skin is warm and dry.  Neurological:     Mental Status: He is alert and oriented to person, place, and time.     Cranial Nerves: Facial asymmetry present.     Coordination: Coordination normal.     Comments: He is unable to close his right eye completely and there is right facial droop present.    Psychiatric:        Behavior: Behavior normal. Behavior is cooperative.        Thought Content: Thought content normal.        Judgment: Judgment normal.         Patient has been counseled extensively about nutrition and exercise as well as the importance of adherence with medications and regular follow-up. The patient was given  clear instructions to go to ER or return to medical center if symptoms don't improve, worsen or new problems develop. The patient verbalized understanding.   Follow-up: Return in about 4 weeks (around 10/28/2018) for bells palsy.   Claiborne Rigg, FNP-BC [facility] [place], [place] [phone]   10/02/2018, 2:27 PM

## 2018-10-02 ENCOUNTER — Encounter: Payer: Self-pay | Admitting: Nurse Practitioner

## 2018-11-03 ENCOUNTER — Ambulatory Visit: Payer: Self-pay | Admitting: Nurse Practitioner

## 2018-11-11 ENCOUNTER — Other Ambulatory Visit: Payer: Self-pay

## 2018-11-11 ENCOUNTER — Encounter: Payer: Self-pay | Admitting: Nurse Practitioner

## 2018-11-11 ENCOUNTER — Ambulatory Visit: Payer: Self-pay | Attending: Nurse Practitioner | Admitting: Nurse Practitioner

## 2018-11-11 VITALS — BP 111/76 | HR 78 | Temp 98.1°F | Ht 65.0 in | Wt 154.2 lb

## 2018-11-11 DIAGNOSIS — G8929 Other chronic pain: Secondary | ICD-10-CM

## 2018-11-11 DIAGNOSIS — M546 Pain in thoracic spine: Secondary | ICD-10-CM

## 2018-11-11 DIAGNOSIS — Z0000 Encounter for general adult medical examination without abnormal findings: Secondary | ICD-10-CM

## 2018-11-11 DIAGNOSIS — G510 Bell's palsy: Secondary | ICD-10-CM

## 2018-11-11 DIAGNOSIS — M25561 Pain in right knee: Secondary | ICD-10-CM

## 2018-11-11 MED ORDER — HYPROMELLOSE (GONIOSCOPIC) 2.5 % OP SOLN: 1.0000 [drp] | Freq: Four times a day (QID) | OPHTHALMIC | 12 refills | Status: DC | PRN

## 2018-11-11 MED ORDER — DICLOFENAC SODIUM 1 % TD GEL: 2.0000 g | Freq: Four times a day (QID) | TRANSDERMAL | 1 refills | Status: AC

## 2018-11-11 MED ORDER — MELOXICAM 15 MG PO TABS: 15.0000 mg | ORAL_TABLET | Freq: Every day | ORAL | 3 refills | Status: DC

## 2018-11-11 MED ORDER — CYCLOBENZAPRINE HCL 10 MG PO TABS: 10.0000 mg | ORAL_TABLET | Freq: Three times a day (TID) | ORAL | 0 refills | Status: DC | PRN

## 2018-11-11 MED FILL — MELOXICAM 15 MG TABLET: 15 | 30 days supply | Qty: 30 | Fill #0

## 2018-11-11 MED FILL — CYCLOBENZAPRINE 10 MG TAB: 10 | 20 days supply | Qty: 60 | Fill #0

## 2018-11-11 MED FILL — DICLOFENAC SODIUM 1% GEL: 1 | 30 days supply | Qty: 100 | Fill #0

## 2018-11-11 NOTE — Progress Notes (Signed)
Assessment & Plan:  Donald Briggs was seen today for follow-up.  Diagnoses and all orders for this visit:  Bell's palsy -     hydroxypropyl methylcellulose / hypromellose (ISOPTO TEARS / GONIOVISC) 2.5 % ophthalmic solution; Place 1 drop into the right eye 4 (four) times daily as needed for dry eyes.  Chronic right-sided thoracic back pain -     meloxicam (MOBIC) 15 MG tablet; Take 1 tablet (15 mg total) by mouth daily. -     cyclobenzaprine (FLEXERIL) 10 MG tablet; Take 1 tablet (10 mg total) by mouth 3 (three) times daily as needed for muscle spasms.  Chronic pain of right knee -     DG Knee Complete 4 Views Right; Future -     diclofenac sodium (VOLTAREN) 1 % GEL; Apply 2 g topically 4 (four) times daily for 30 days. Work on losing weight to help reduce joint pain. May alternate with heat and ice application for pain relief. May also alternate with acetaminophen as prescribed pain relief. Other alternatives include massage, acupuncture and water aerobics.  You must stay active and avoid a sedentary lifestyle.  Routine adult health maintenance -     CBC -     CMP14+EGFR -     Lipid panel    Patient has been counseled on age-appropriate routine health concerns for screening and prevention. These are reviewed and up-to-date. Referrals have been placed accordingly. Immunizations are up-to-date or declined.    Subjective:   Chief Complaint  Patient presents with  . Follow-up    Pt. is here for Bells' Palsy follow-up. Pt. stated his knee are giving him pain. Pt. still have right side back pain.    HPI Donald Briggs 43 y.o. male presents to office today to follow up. He was diagnosed with Bell's Palsy 09-02-2018. Currently has mild residual right facial   droop with improvement in the ability to close the right eye.  Knee Pain He has complaints of bilateral knee pain R>L. Previous jobs include working in Architect. States knee pain has been ongoing "for a long time" with worsening  over the past few months. Pain is worse in the morning upon awakening then tends to decrease as the day progresses.  Current symptoms include crepitus sensation, stiffness and swelling. Pain is aggravated by inactivity, kneeling and squatting.  Patient has had prior knee problems. Evaluation to date: none. Treatment to date: prescription NSAIDS which are not very effective.   Thoracic Back Pain Chronic. Medications tried in the past Tramadol, Ibuprofen, Naprosyn with little relief of pain. Work history: Architect work with heavy lifting. He does not wear a back brace. Aggravating factors: lifting, bending, twisting. There is no sciatica, numbness or involuntary loss of bowel or bladder. Currently taking meloxicam which he states provides significant relief of his back pain but not his knee pain.    Review of Systems  Constitutional: Negative for fever, malaise/fatigue and weight loss.  HENT: Negative.  Negative for nosebleeds.   Eyes: Negative.  Negative for blurred vision, double vision and photophobia.  Respiratory: Negative.  Negative for cough and shortness of breath.   Cardiovascular: Negative.  Negative for chest pain, palpitations and leg swelling.  Gastrointestinal: Negative.  Negative for heartburn, nausea and vomiting.  Musculoskeletal: Positive for back pain and joint pain. Negative for falls and myalgias.       SEE HPI  Neurological: Positive for sensory change. Negative for dizziness, focal weakness, seizures and headaches.  Psychiatric/Behavioral: Negative.  Negative for suicidal ideas.  Past Medical History:  Diagnosis Date  . Back pain   . Bell's palsy     History reviewed. No pertinent surgical history.  Family History  Problem Relation Age of Onset  . Cancer Mother     Social History Reviewed with no changes to be made today.   Outpatient Medications Prior to Visit  Medication Sig Dispense Refill  . cyclobenzaprine (FLEXERIL) 10 MG tablet Take 1 tablet (10  mg total) by mouth 3 (three) times daily as needed for muscle spasms. 60 tablet 0  . meloxicam (MOBIC) 15 MG tablet Take 1 tablet (15 mg total) by mouth daily. 30 tablet 3  . pantoprazole (PROTONIX) 20 MG tablet Take 1 tablet (20 mg total) by mouth daily. (Patient not taking: Reported on 09/02/2017) 30 tablet 0  . hydroxypropyl methylcellulose / hypromellose (ISOPTO TEARS / GONIOVISC) 2.5 % ophthalmic solution Place 1 drop into the right eye 4 (four) times daily as needed for dry eyes. (Patient not taking: Reported on 11/11/2018) 15 mL 12   No facility-administered medications prior to visit.     No Known Allergies     Objective:    BP 111/76 (BP Location: Right Arm, Patient Position: Sitting, Cuff Size: Normal)   Pulse 78   Temp 98.1 F (36.7 C) (Oral)   Ht '5\' 5"'  (1.651 m)   Wt 154 lb 3.2 oz (69.9 kg)   SpO2 94%   BMI 25.66 kg/m  Wt Readings from Last 3 Encounters:  11/11/18 154 lb 3.2 oz (69.9 kg)  09/30/18 155 lb 6.4 oz (70.5 kg)  09/02/18 140 lb (63.5 kg)    Physical Exam Vitals signs and nursing note reviewed.  Constitutional:      Appearance: He is well-developed.  HENT:     Head: Normocephalic and atraumatic.  Eyes:     General: No visual field deficit. Neck:     Musculoskeletal: Normal range of motion.  Cardiovascular:     Rate and Rhythm: Normal rate and regular rhythm.     Heart sounds: Normal heart sounds. No murmur. No friction rub. No gallop.   Pulmonary:     Effort: Pulmonary effort is normal. No tachypnea or respiratory distress.     Breath sounds: Normal breath sounds. No decreased breath sounds, wheezing, rhonchi or rales.  Chest:     Chest wall: No tenderness.  Abdominal:     General: Bowel sounds are normal.     Palpations: Abdomen is soft.  Musculoskeletal: Normal range of motion.     Right knee: He exhibits swelling. He exhibits normal range of motion. Tenderness found. Patellar tendon tenderness noted.     Thoracic back: He exhibits normal range  of motion, no tenderness, no swelling and no spasm.  Skin:    General: Skin is warm and dry.  Neurological:     Mental Status: He is alert and oriented to person, place, and time.     Cranial Nerves: Facial asymmetry present. No dysarthria.     Coordination: Coordination normal.     Comments: Right facial droop; mild/improving  Psychiatric:        Behavior: Behavior normal. Behavior is cooperative.        Thought Content: Thought content normal.        Judgment: Judgment normal.        Patient has been counseled extensively about nutrition and exercise as well as the importance of adherence with medications and regular follow-up. The patient was given clear instructions to go to ER  or return to medical center if symptoms don't improve, worsen or new problems develop. The patient verbalized understanding.   Follow-up: Return in about 2 months (around 01/11/2019) for right knee pain and needs financial paperwork.   Gildardo Pounds, FNP-BC [facility] [place], [place]   11/11/2018, 1:41 PM

## 2018-11-12 ENCOUNTER — Encounter: Payer: Self-pay | Admitting: Nurse Practitioner

## 2018-11-12 LAB — LIPID PANEL
CHOL/HDL RATIO: 4 ratio (ref 0.0–5.0)
Cholesterol, Total: 175 mg/dL (ref 100–199)
HDL: 44 mg/dL (ref >39)
LDL Calculated: 101 mg/dL — ABNORMAL HIGH (ref 0–99)
TRIGLYCERIDES: 150 mg/dL — AB (ref 0–149)
VLDL Cholesterol Cal: 30 mg/dL (ref 5–40)

## 2018-11-12 LAB — CMP14+EGFR
A/G RATIO: 1.4 (ref 1.2–2.2)
ALT: 31 IU/L (ref 0–44)
AST: 36 IU/L (ref 0–40)
Albumin: 4.4 g/dL (ref 4.0–5.0)
Alkaline Phosphatase: 108 IU/L (ref 39–117)
BUN/Creatinine Ratio: 9 (ref 9–20)
BUN: 7 mg/dL (ref 6–24)
Bilirubin Total: 0.3 mg/dL (ref 0.0–1.2)
CO2: 24 mmol/L (ref 20–29)
Calcium: 8.9 mg/dL (ref 8.7–10.2)
Chloride: 104 mmol/L (ref 96–106)
Creatinine, Ser: 0.74 mg/dL — ABNORMAL LOW (ref 0.76–1.27)
GFR calc Af Amer: 130 mL/min/{1.73_m2} (ref >59)
GFR calc non Af Amer: 112 mL/min/{1.73_m2} (ref >59)
GLOBULIN, TOTAL: 3.1 g/dL (ref 1.5–4.5)
Glucose: 111 mg/dL — ABNORMAL HIGH (ref 65–99)
Potassium: 4.3 mmol/L (ref 3.5–5.2)
Sodium: 142 mmol/L (ref 134–144)
Total Protein: 7.5 g/dL (ref 6.0–8.5)

## 2018-11-12 LAB — CBC
Hematocrit: 44.8 % (ref 37.5–51.0)
Hemoglobin: 14.6 g/dL (ref 13.0–17.7)
MCH: 29.1 pg (ref 26.6–33.0)
MCHC: 32.6 g/dL (ref 31.5–35.7)
MCV: 89 fL (ref 79–97)
Platelets: 220 10*3/uL (ref 150–450)
RBC: 5.02 x10E6/uL (ref 4.14–5.80)
RDW: 14.3 % (ref 11.6–15.4)
WBC: 5.3 10*3/uL (ref 3.4–10.8)

## 2018-11-13 ENCOUNTER — Telehealth: Payer: Self-pay

## 2018-11-13 NOTE — Telephone Encounter (Signed)
CMA spoke to patient's wife to inform on lab results.

## 2018-11-13 NOTE — Telephone Encounter (Signed)
-----   Message from Claiborne Rigg, NP sent at 11/12/2018 11:35 AM EDT ----- Labs are essentially normal including kidney function and liver function. There is no anemia. Cholesterol levels are slightly elevated. Some things you can do to help keep your cholesterol levels down are avoid red meat, fried foods. junk foods, unhealthy snacking, alcohol and smoking.  Drink at least 48oz of water per day

## 2018-11-17 ENCOUNTER — Ambulatory Visit: Payer: Self-pay | Admitting: Pulmonary Disease

## 2018-11-24 ENCOUNTER — Telehealth: Payer: Self-pay | Admitting: Pharmacist

## 2018-11-24 NOTE — Telephone Encounter (Signed)
Bien please let patient know these eye drops are available over the counter and our pharmacy does not have in stock

## 2018-11-24 NOTE — Telephone Encounter (Signed)
Contacted by [facility] pharmacy; informed that Gonak cannot be ordered. Isopto or generic mellose-based eye lubricants are available over the counter. Will route to PCP for review.

## 2018-11-27 NOTE — Telephone Encounter (Signed)
CMA spoke to patient and notified Patient they can purchased eye drop OTC due to pharmacy do not have in stock. Pt. Stated he already bought eye drops and understood.

## 2018-12-17 ENCOUNTER — Telehealth: Payer: Self-pay | Admitting: Nurse Practitioner

## 2018-12-17 DIAGNOSIS — M546 Pain in thoracic spine: Principal | ICD-10-CM

## 2018-12-17 DIAGNOSIS — G8929 Other chronic pain: Secondary | ICD-10-CM

## 2018-12-17 NOTE — Telephone Encounter (Signed)
1) Medication(s) Requested (by name): cyclobenzaprine (FLEXERIL) 10 MG tablet  diclofenac sodium (VOLTAREN) 1 % GEL   2) Pharmacy of Choice: [facility] 3) Special Requests:   Approved medications will be sent to the pharmacy, we will reach out if there is an issue.  Requests made after 3pm may not be addressed until the following business day!  If a patient is unsure of the name of the medication(s) please note and ask patient to call back when they are able to provide all info, do not send to responsible party until all information is available!

## 2018-12-18 MED ORDER — CYCLOBENZAPRINE HCL 10 MG PO TABS: 10.0000 mg | ORAL_TABLET | Freq: Three times a day (TID) | ORAL | 0 refills | Status: DC | PRN

## 2018-12-18 MED FILL — CYCLOBENZAPRINE 10 MG TAB: 10 | 20 days supply | Qty: 60 | Fill #0

## 2018-12-18 MED FILL — DICLOFENAC SODIUM 1% GEL: 1 | 30 days supply | Qty: 100 | Fill #1

## 2018-12-19 ENCOUNTER — Ambulatory Visit (HOSPITAL_COMMUNITY)
Admission: EM | Admit: 2018-12-19 | Discharge: 2018-12-19 | Disposition: A | Discharge status: Home / Self Care | Payer: Self-pay | Attending: Emergency Medicine | Admitting: Emergency Medicine

## 2018-12-19 ENCOUNTER — Other Ambulatory Visit: Payer: Self-pay

## 2018-12-19 DIAGNOSIS — M62830 Muscle spasm of back: Secondary | ICD-10-CM

## 2018-12-19 DIAGNOSIS — G8929 Other chronic pain: Secondary | ICD-10-CM

## 2018-12-19 DIAGNOSIS — M546 Pain in thoracic spine: Secondary | ICD-10-CM

## 2018-12-19 MED ORDER — NAPROXEN 375 MG PO TABS: 375.0000 mg | ORAL_TABLET | Freq: Two times a day (BID) | ORAL | 0 refills | Status: DC

## 2018-12-19 NOTE — ED Provider Notes (Signed)
[facility]   [id number] 12/19/18 Arrival Time: 1257  WU:[id number]:JOINT PAIN  SUBJECTIVE: [facility] [place] [phone]#[id number]   Massie BougieBenigno Douds is a 43 y.o. male complains of right sided back pain that began 1 year ago, but worsened over the past week.  Denies a precipitating event or specific injury, but works in [employer] representative[employer] and attributes his symptoms to his job duties.  Localizes the pain to the RT back.  Describes the pain as constant and 9/10.  Has tried OTC medications without relief.  Symptoms are made worse with bending over and twisting motions. Complains of intermittent hand numbness. Denies fever, chills, erythema, ecchymosis, effusion, weakness, tingling, changes in bowel or bladder function.    ROS: As per HPI.  Past Medical History:  Diagnosis Date  . Back pain   . Bell's palsy    No past surgical history on file. No Known Allergies No current facility-administered medications on file prior to encounter.    Current Outpatient Medications on File Prior to Encounter  Medication Sig Dispense Refill  . cyclobenzaprine (FLEXERIL) 10 MG tablet Take 1 tablet (10 mg total) by mouth 3 (three) times daily as needed for muscle spasms. 60 tablet 0  . hydroxypropyl methylcellulose / hypromellose (ISOPTO TEARS / GONIOVISC) 2.5 % ophthalmic solution Place 1 drop into the right eye 4 (four) times daily as needed for dry eyes. 15 mL 12  . meloxicam (MOBIC) 15 MG tablet Take 1 tablet (15 mg total) by mouth daily. 30 tablet 3  . pantoprazole (PROTONIX) 20 MG tablet Take 1 tablet (20 mg total) by mouth daily. (Patient not taking: Reported on 09/02/2017) 30 tablet 0   Social History   Socioeconomic History  . Marital status: Married    Spouse name: Not on file  . Number of children: Not on file  . Years of education: Not on file  . Highest education level: Not on file  Occupational History  . Not on file  Social Needs  . Financial resource strain: Not on file  .  Food insecurity:    Worry: Not on file    Inability: Not on file  . Transportation needs:    Medical: Not on file    Non-medical: Not on file  Tobacco Use  . Smoking status: Current Every Day Smoker  . Smokeless tobacco: Never Used  . Tobacco comment: 2 cigarettes per day   Substance and Sexual Activity  . Alcohol use: Yes  . Drug use: No  . Sexual activity: Not on file  Lifestyle  . Physical activity:    Days per week: Not on file    Minutes per session: Not on file  . Stress: Not on file  Relationships  . Social connections:    Talks on phone: Not on file    Gets together: Not on file    Attends religious service: Not on file    Active member of club or organization: Not on file    Attends meetings of clubs or organizations: Not on file    Relationship status: Not on file  . Intimate partner violence:    Fear of current or ex partner: Not on file    Emotionally abused: Not on file    Physically abused: Not on file    Forced sexual activity: Not on file  Other Topics Concern  . Not on file  Social History Narrative  . Not on file   Family History  Problem Relation Age of Onset  .  Cancer Mother     OBJECTIVE:  Vitals:   12/19/18 1328  BP: 122/76  Pulse: 93  Resp: 16  Temp: 98.6 F (37 C)  TempSrc: Oral  SpO2: 96%    General appearance: Alert; in no acute distress.  Head: NCAT Lungs: CTA bilaterally Heart: RRR.   Musculoskeletal: Back Inspection: Skin warm, dry, clear and intact without obvious erythema, effusion, or ecchymosis.  Palpation: TTP over inferior trapezius and RT paravertebral muscles ROM: FROM active and passive Strength: 5/5 shld abduction, 5/5 shld adduction, 5/5 elbow flexion, 5/5 elbow extension, 5/5 grip strength, 5/5 hip flexion, 5/5 knee abduction, 5/5 knee adduction, 5/5 knee flexion, 5/5 knee extension Skin: warm and dry Neurologic: Ambulates without difficulty Psychological: alert and cooperative; normal mood and affect   ASSESSMENT & PLAN:  1. Chronic right-sided thoracic back pain   2. Back spasm     Meds ordered this encounter  Medications  . naproxen (NAPROSYN) 375 MG tablet    Sig: Take 1 tablet (375 mg total) by mouth 2 (two) times daily.    Dispense:  10 tablet    Refill:  0    Order Specific Question:   Supervising Provider    Answer:   Eustace Moore [[id number]]   It appears your family doctor has sent in prescriptions for diclofenac gel and the muscle relaxer flexeril.  Therefore I will discontinue my flexeril prescription for today.  Take this medication as prescribed for muscle spasm.  Continue conservative management of rest, ice, heat and gentle stretches Take naproxen as needed for pain relief (may cause abdominal discomfort, ulcers, and GI bleeds avoid taking with other NSAIDs) Follow up with PCP if symptoms persist Return or go to the ER if you have any new or worsening symptoms (fever, chills, chest pain, abdominal pain, changes in bowel or bladder habits, pain radiating into lower legs, etc...)   Reviewed expectations re: course of current medical issues. Questions answered. Outlined signs and symptoms indicating need for more acute intervention. Patient verbalized understanding. After Visit Summary given.    Rennis Harding, PA-C 12/19/18 1448

## 2018-12-19 NOTE — ED Triage Notes (Signed)
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 Hard to bend over and lift anything.

## 2018-12-19 NOTE — Discharge Instructions (Signed)
Parece que su mdico de familia ha enviado recetas para el gel de diclofenaco y el relajante muscular flexeril.  Por lo tanto, voy a interrumpir mi prescripcin flexeril para hoy.  Tome este medicamento segn lo prescrito para el espasmo muscular.  Continuar la gestin conservadora del descanso, el hielo, Company secretary y los estiramientos suaves Tome naproxeno segn sea necesario para Engineer, materials (puede causar molestias abdominales, lceras y sangrados gastrointestinales evitar tomar con otros AINE como MOBIC) Seguimiento con PCP si los sntomas persisten Regrese o vaya a Urgencias si tiene sntomas nuevos o que empeoran (fiebre, escalofros, Journalist, newspaper, dolor abdominal, cambios en los hbitos intestinales o vesicales, dolor que se irradia en la parte inferior de las piernas, etc...)    It appears your family doctor has sent in prescriptions for diclofenac gel and the muscle relaxor flexeril.  Therefore I will discontinue my flexeril prescription for today.  Take this medication as prescribed for muscle spasm.  Continue conservative management of rest, ice, heat and gentle stretches Take naproxen as needed for pain relief (may cause abdominal discomfort, ulcers, and GI bleeds avoid taking with other NSAIDs like MOBIC) Follow up with PCP if symptoms persist Return or go to the ER if you have any new or worsening symptoms (fever, chills, chest pain, abdominal pain, changes in bowel or bladder habits, pain radiating into lower legs, etc...)

## 2019-01-12 ENCOUNTER — Other Ambulatory Visit: Payer: Self-pay

## 2019-01-12 ENCOUNTER — Ambulatory Visit: Payer: Self-pay | Attending: Nurse Practitioner | Admitting: Nurse Practitioner

## 2019-01-12 ENCOUNTER — Encounter: Payer: Self-pay | Admitting: Nurse Practitioner

## 2019-01-12 DIAGNOSIS — G8929 Other chronic pain: Secondary | ICD-10-CM

## 2019-01-12 DIAGNOSIS — M25562 Pain in left knee: Secondary | ICD-10-CM

## 2019-01-12 MED ORDER — NAPROXEN 375 MG PO TABS: 375.0000 mg | ORAL_TABLET | Freq: Two times a day (BID) | ORAL | 0 refills | Status: DC

## 2019-01-12 MED FILL — NAPROXEN 375 MG TABLET: 375 | 30 days supply | Qty: 60 | Fill #0

## 2019-01-12 NOTE — Progress Notes (Signed)
Virtual Visit via Telephone Note Due to national recommendations of social distancing due to COVID 19, telehealth visit is felt to be most appropriate for this patient at this time.  I discussed the limitations, risks, security and privacy concerns of performing an evaluation and management service by telephone and the availability of in person appointments. I also discussed with the patient that there may be a patient responsible charge related to this service. The patient expressed understanding and agreed to proceed.    I connected with Thos Battaglia on 01/12/19  at   9:50 AM EDT  EDT by telephone and verified that I am speaking with the correct person using two identifiers.   Consent I discussed the limitations, risks, security and privacy concerns of performing an evaluation and management service by telephone and the availability of in person appointments. I also discussed with the patient that there may be a patient responsible charge related to this service. The patient expressed understanding and agreed to proceed.   Location of Patient: Private Residence   Location of Provider: [facility]    Persons participating in Telemedicine visit: Bertram Denver FNP-BC [facility] CMA Rain Weinrich  Spanish Interpreter [phone]   History of Present Illness: Telemedicine visit for: Follow up to right knee pain.   Knee pain He has chronic B/L knee pain with R>L. Previous jobs include working in Holiday representative. States knee pain has been ongoing "for a long time" with worsening over the past few months. Pain is worse in the morning upon awakening then tends to decrease as the day progresses.  Current symptoms include crepitus sensation, stiffness and swelling. Pain is aggravated by inactivity, kneeling and squatting.  Patient has had prior knee problems. Evaluation to date: none. Treatment to date: prescription NSAIDS which are not very effective. Medications tried  include naproxen, meloxicam. Naproxen seems to provide more relief.   Past Medical History:  Diagnosis Date  . Back pain   . Bell's palsy     History reviewed. No pertinent surgical history.  Family History  Problem Relation Age of Onset  . Cancer Mother     Social History   Socioeconomic History  . Marital status: Married    Spouse name: Not on file  . Number of children: Not on file  . Years of education: Not on file  . Highest education level: Not on file  Occupational History  . Not on file  Social Needs  . Financial resource strain: Not on file  . Food insecurity:    Worry: Not on file    Inability: Not on file  . Transportation needs:    Medical: Not on file    Non-medical: Not on file  Tobacco Use  . Smoking status: Current Every Day Smoker  . Smokeless tobacco: Never Used  . Tobacco comment: 2 cigarettes per day   Substance and Sexual Activity  . Alcohol use: Yes  . Drug use: No  . Sexual activity: Not on file  Lifestyle  . Physical activity:    Days per week: Not on file    Minutes per session: Not on file  . Stress: Not on file  Relationships  . Social connections:    Talks on phone: Not on file    Gets together: Not on file    Attends religious service: Not on file    Active member of club or organization: Not on file    Attends meetings of clubs or organizations: Not on file  Relationship status: Not on file  Other Topics Concern  . Not on file  Social History Narrative  . Not on file     Observations/Objective: Awake, alert and oriented x 3   Review of Systems  Constitutional: Negative for fever, malaise/fatigue and weight loss.  HENT: Negative.  Negative for nosebleeds.   Eyes: Negative.  Negative for blurred vision, double vision and photophobia.  Respiratory: Negative.  Negative for cough and shortness of breath.   Cardiovascular: Negative.  Negative for chest pain, palpitations and leg swelling.  Gastrointestinal: Negative.   Negative for heartburn, nausea and vomiting.  Musculoskeletal: Positive for joint pain. Negative for myalgias.  Neurological: Negative.  Negative for dizziness, focal weakness, seizures and headaches.  Psychiatric/Behavioral: Negative.  Negative for suicidal ideas.    Assessment and Plan: [place] was seen today for follow-up.  Diagnoses and all orders for this visit:  Chronic pain of both knees -     DG Knee Complete 4 Views Left; Future -     Uric Acid  Other orders -     naproxen (NAPROSYN) 375 MG tablet; Take 1 tablet (375 mg total) by mouth 2 (two) times daily.     Follow Up Instructions Return in about 3 months (around 04/14/2019).     I discussed the assessment and treatment plan with the patient. The patient was provided an opportunity to ask questions and all were answered. The patient agreed with the plan and demonstrated an understanding of the instructions.   The patient was advised to call back or seek an in-person evaluation if the symptoms worsen or if the condition fails to improve as anticipated.  I provided 21 minutes of non-face-to-face time during this encounter including median intraservice time, reviewing previous notes, labs, imaging, medications and explaining diagnosis and management.  Claiborne RiggZelda W Fleming, FNP-BC

## 2019-01-13 LAB — URIC ACID: Uric Acid: 4.4 mg/dL (ref 3.7–8.6)

## 2019-01-15 ENCOUNTER — Telehealth: Payer: Self-pay

## 2019-01-15 NOTE — Telephone Encounter (Signed)
CMA spoke to patient to inform on labs results.  Pt. Understood.  Pt. Verified DOB.  Pt. Was inform to get his Xray done.  Pt. Stated he will get them done.   Spanish interpreter Ephriam Knuckles [phone] assist with the call.

## 2019-01-15 NOTE — Telephone Encounter (Signed)
-----   Message from Claiborne Rigg, NP sent at 01/14/2019  7:21 PM EDT ----- Uric acid level is normal. There is no gout in your joints. Still waiting for xrays.

## 2019-02-03 ENCOUNTER — Other Ambulatory Visit: Payer: Self-pay

## 2019-02-03 ENCOUNTER — Other Ambulatory Visit: Payer: Self-pay | Admitting: Nurse Practitioner

## 2019-02-03 ENCOUNTER — Ambulatory Visit (HOSPITAL_COMMUNITY)
Admission: RE | Admit: 2019-02-03 | Discharge: 2019-02-03 | Disposition: A | Discharge status: Home / Self Care | Payer: Self-pay | Source: Ambulatory Visit | Attending: Nurse Practitioner | Admitting: Nurse Practitioner

## 2019-02-03 DIAGNOSIS — G8929 Other chronic pain: Secondary | ICD-10-CM | POA: Insufficient documentation

## 2019-02-03 DIAGNOSIS — M25561 Pain in right knee: Secondary | ICD-10-CM | POA: Insufficient documentation

## 2019-02-03 DIAGNOSIS — M25462 Effusion, left knee: Secondary | ICD-10-CM

## 2019-02-03 DIAGNOSIS — M25562 Pain in left knee: Secondary | ICD-10-CM | POA: Insufficient documentation

## 2019-02-05 NOTE — Progress Notes (Signed)
CMA spoke to patient to inform on Xray of the knee results.  Pt. Verified DOB. Pt. Understood.  Spanish interpreter Richmond Campbell [phone] assist with the call.

## 2019-12-08 IMAGING — DX RIGHT KNEE - COMPLETE 4+ VIEW
4 series · 4 of 4 positions shown · non-contrast
Comparison: None.

CLINICAL DATA: Chronic bilateral knee pain, left greater than
right.

EXAM:
RIGHT KNEE - COMPLETE 4+ VIEW

[w knee ap right]
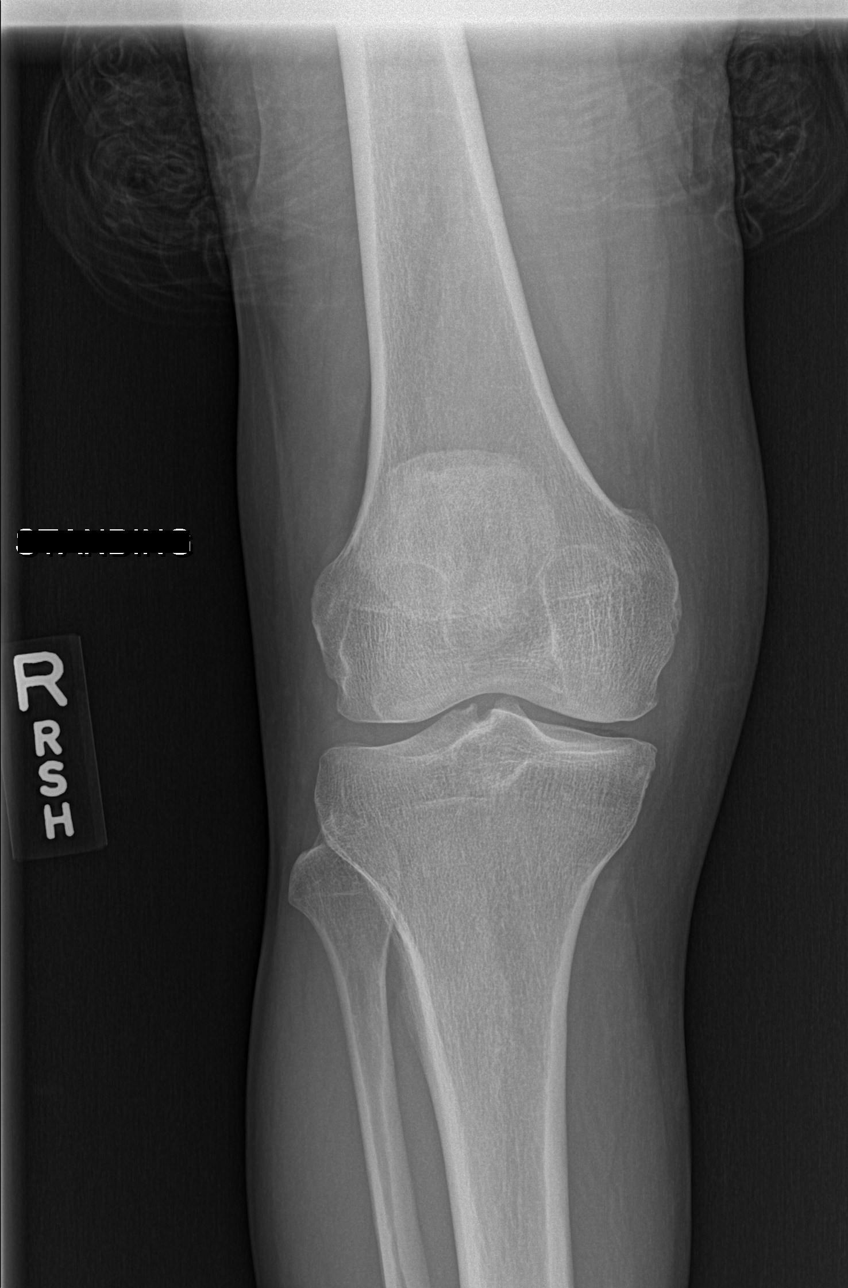

[w knee obl right]
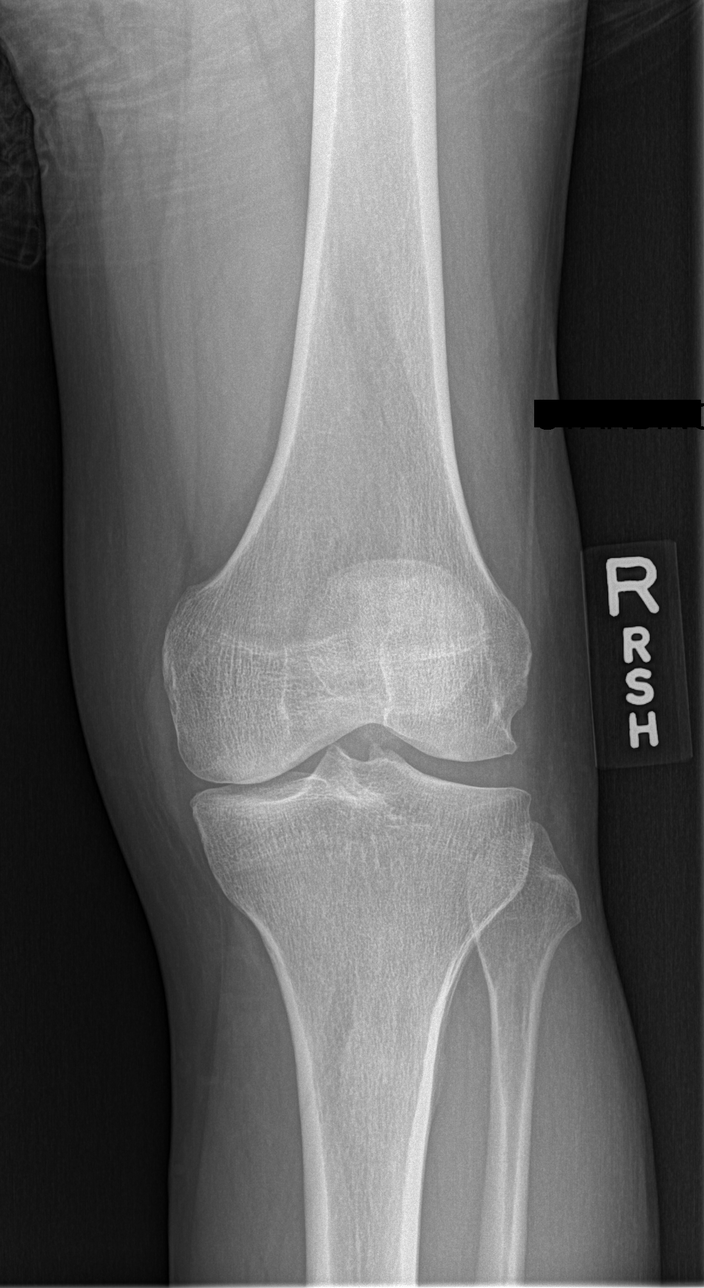

[x knee tunnel right]
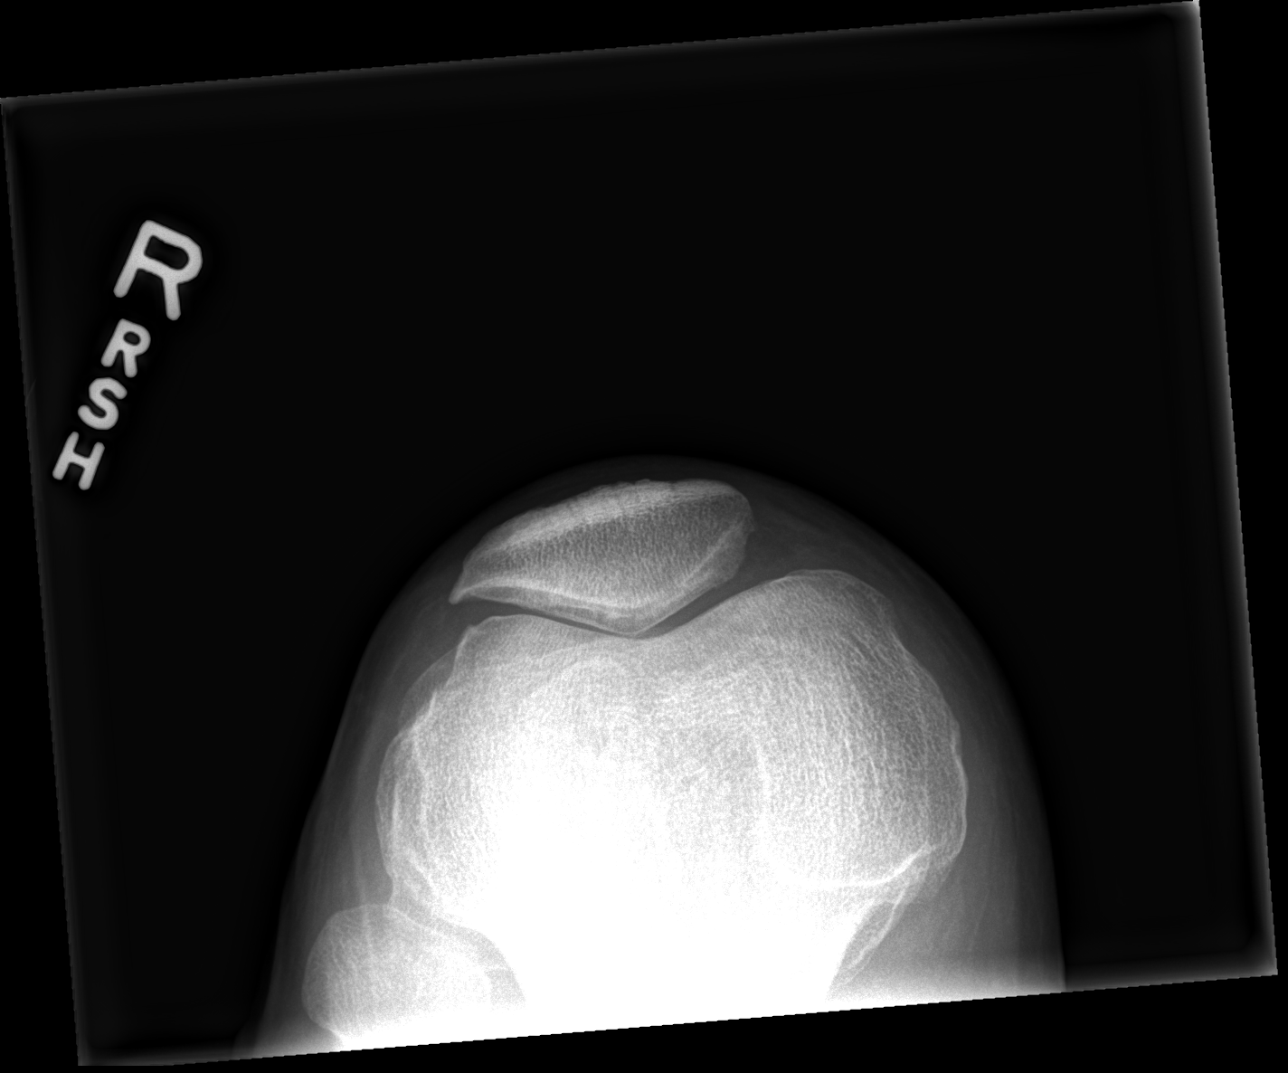

[t knee lat right]
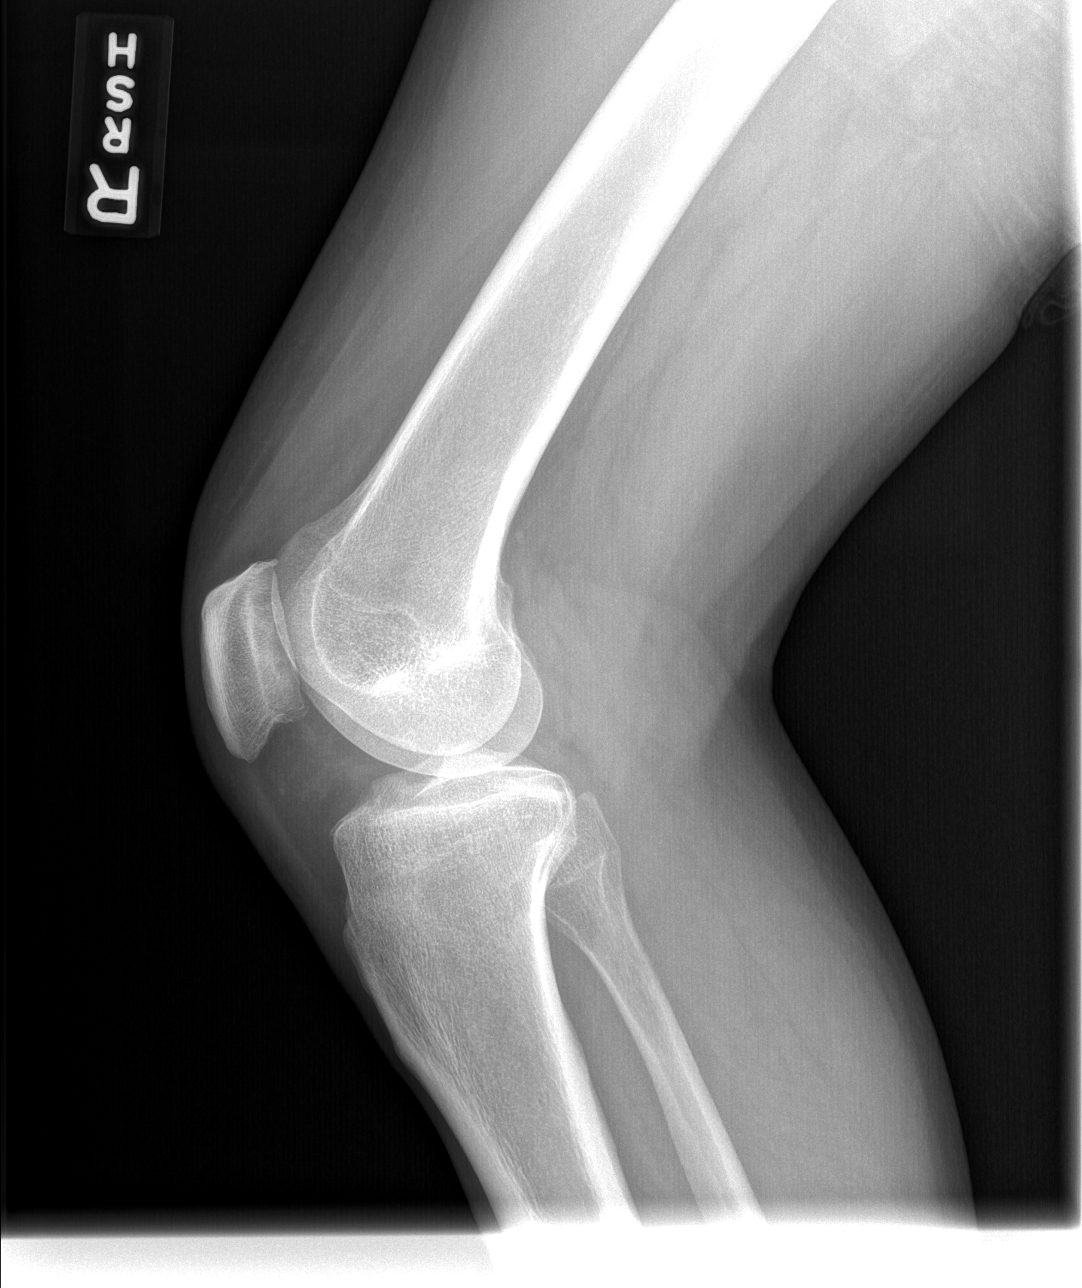

[4 of 4 positions shown; findings below may reference images not displayed]

FINDINGS: Mild/early degenerative changes for age with peaking of the tibial
spines and early joint space narrowing. No fracture, osteochondral
lesion, erosions or chondrocalcinosis. No joint effusion.
IMPRESSION: Mild/early degenerative changes for age but no acute bony findings
or joint effusion.

## 2021-01-30 ENCOUNTER — Inpatient Hospital Stay (HOSPITAL_COMMUNITY)
Admission: EM | Admit: 2021-01-30 | Discharge: 2021-02-04 | DRG: 432 | Disposition: A | Discharge status: Home / Self Care | Payer: Self-pay | Attending: Family Medicine | Admitting: Family Medicine

## 2021-01-30 ENCOUNTER — Encounter (HOSPITAL_COMMUNITY): Payer: Self-pay | Admitting: Emergency Medicine

## 2021-01-30 ENCOUNTER — Emergency Department (HOSPITAL_COMMUNITY): Payer: Self-pay

## 2021-01-30 ENCOUNTER — Other Ambulatory Visit: Payer: Self-pay

## 2021-01-30 DIAGNOSIS — K766 Portal hypertension: Secondary | ICD-10-CM | POA: Diagnosis present

## 2021-01-30 DIAGNOSIS — K920 Hematemesis: Secondary | ICD-10-CM

## 2021-01-30 DIAGNOSIS — Z28310 Unvaccinated for covid-19: Secondary | ICD-10-CM

## 2021-01-30 DIAGNOSIS — R188 Other ascites: Secondary | ICD-10-CM

## 2021-01-30 DIAGNOSIS — D696 Thrombocytopenia, unspecified: Secondary | ICD-10-CM | POA: Diagnosis present

## 2021-01-30 DIAGNOSIS — Z20822 Contact with and (suspected) exposure to covid-19: Secondary | ICD-10-CM | POA: Diagnosis present

## 2021-01-30 DIAGNOSIS — K3189 Other diseases of stomach and duodenum: Secondary | ICD-10-CM | POA: Diagnosis present

## 2021-01-30 DIAGNOSIS — D689 Coagulation defect, unspecified: Secondary | ICD-10-CM | POA: Diagnosis present

## 2021-01-30 DIAGNOSIS — F10239 Alcohol dependence with withdrawal, unspecified: Secondary | ICD-10-CM | POA: Diagnosis not present

## 2021-01-30 DIAGNOSIS — K7010 Alcoholic hepatitis without ascites: Secondary | ICD-10-CM | POA: Diagnosis present

## 2021-01-30 DIAGNOSIS — E876 Hypokalemia: Secondary | ICD-10-CM | POA: Diagnosis present

## 2021-01-30 DIAGNOSIS — R791 Abnormal coagulation profile: Secondary | ICD-10-CM | POA: Diagnosis present

## 2021-01-30 DIAGNOSIS — F17210 Nicotine dependence, cigarettes, uncomplicated: Secondary | ICD-10-CM | POA: Diagnosis present

## 2021-01-30 DIAGNOSIS — E871 Hypo-osmolality and hyponatremia: Secondary | ICD-10-CM | POA: Diagnosis present

## 2021-01-30 DIAGNOSIS — I5033 Acute on chronic diastolic (congestive) heart failure: Secondary | ICD-10-CM | POA: Diagnosis present

## 2021-01-30 DIAGNOSIS — K921 Melena: Secondary | ICD-10-CM | POA: Diagnosis present

## 2021-01-30 DIAGNOSIS — F1010 Alcohol abuse, uncomplicated: Secondary | ICD-10-CM | POA: Diagnosis present

## 2021-01-30 DIAGNOSIS — Z7141 Alcohol abuse counseling and surveillance of alcoholic: Secondary | ICD-10-CM

## 2021-01-30 DIAGNOSIS — R0789 Other chest pain: Secondary | ICD-10-CM | POA: Diagnosis present

## 2021-01-30 DIAGNOSIS — J811 Chronic pulmonary edema: Secondary | ICD-10-CM | POA: Diagnosis present

## 2021-01-30 DIAGNOSIS — R1011 Right upper quadrant pain: Secondary | ICD-10-CM

## 2021-01-30 DIAGNOSIS — I81 Portal vein thrombosis: Secondary | ICD-10-CM | POA: Diagnosis present

## 2021-01-30 DIAGNOSIS — K7031 Alcoholic cirrhosis of liver with ascites: Principal | ICD-10-CM

## 2021-01-30 DIAGNOSIS — D62 Acute posthemorrhagic anemia: Secondary | ICD-10-CM | POA: Diagnosis present

## 2021-01-30 DIAGNOSIS — Z79899 Other long term (current) drug therapy: Secondary | ICD-10-CM

## 2021-01-30 DIAGNOSIS — R7401 Elevation of levels of liver transaminase levels: Secondary | ICD-10-CM | POA: Diagnosis present

## 2021-01-30 DIAGNOSIS — K7011 Alcoholic hepatitis with ascites: Secondary | ICD-10-CM | POA: Diagnosis present

## 2021-01-30 DIAGNOSIS — E7220 Disorder of urea cycle metabolism, unspecified: Secondary | ICD-10-CM | POA: Diagnosis present

## 2021-01-30 DIAGNOSIS — I8510 Secondary esophageal varices without bleeding: Secondary | ICD-10-CM | POA: Diagnosis present

## 2021-01-30 HISTORY — DX: Alcohol abuse, uncomplicated: F10.10

## 2021-01-30 HISTORY — DX: Tobacco use: Z72.0

## 2021-01-30 LAB — COMPREHENSIVE METABOLIC PANEL
ALT: 30 U/L (ref 0–44)
AST: 125 U/L — ABNORMAL HIGH (ref 15–41)
Albumin: 2 g/dL — ABNORMAL LOW (ref 3.5–5.0)
Alkaline Phosphatase: 241 U/L — ABNORMAL HIGH (ref 38–126)
Anion gap: 8 (ref 5–15)
BUN: <5 mg/dL — ABNORMAL LOW (ref 6–20)
CO2: 26 mmol/L (ref 22–32)
Calcium: 7.8 mg/dL — ABNORMAL LOW (ref 8.9–10.3)
Chloride: 98 mmol/L (ref 98–111)
Creatinine, Ser: 0.48 mg/dL — ABNORMAL LOW (ref 0.61–1.24)
GFR, Estimated: >60 mL/min (ref >60)
Glucose, Bld: 89 mg/dL (ref 70–99)
Potassium: 3.8 mmol/L (ref 3.5–5.1)
Sodium: 132 mmol/L — ABNORMAL LOW (ref 135–145)
Total Bilirubin: 3.7 mg/dL — ABNORMAL HIGH (ref 0.3–1.2)
Total Protein: 8.3 g/dL — ABNORMAL HIGH (ref 6.5–8.1)

## 2021-01-30 LAB — CBC
HCT: 36.6 % — ABNORMAL LOW (ref 39.0–52.0)
Hemoglobin: 12.2 g/dL — ABNORMAL LOW (ref 13.0–17.0)
MCH: 31.9 pg (ref 26.0–34.0)
MCHC: 33.3 g/dL (ref 30.0–36.0)
MCV: 95.6 fL (ref 80.0–100.0)
Platelets: 78 10*3/uL — ABNORMAL LOW (ref 150–400)
RBC: 3.83 MIL/uL — ABNORMAL LOW (ref 4.22–5.81)
RDW: 15.9 % — ABNORMAL HIGH (ref 11.5–15.5)
WBC: 7.5 10*3/uL (ref 4.0–10.5)
nRBC: 0 % (ref 0.0–0.2)

## 2021-01-30 LAB — AMMONIA: Ammonia: 75 umol/L — ABNORMAL HIGH (ref 9–35)

## 2021-01-30 LAB — HEPATITIS PANEL, ACUTE
HCV Ab: NONREACTIVE
Hep A IgM: NONREACTIVE
Hep B C IgM: NONREACTIVE
Hepatitis B Surface Ag: NONREACTIVE

## 2021-01-30 LAB — TROPONIN I (HIGH SENSITIVITY): Troponin I (High Sensitivity): 9 ng/L (ref <18)

## 2021-01-30 MED ORDER — IBUPROFEN 800 MG PO TABS
800.0000 mg | ORAL_TABLET | Freq: Once | ORAL | Status: AC
  Administered 2021-01-30: 800 mg via ORAL
  Filled 2021-01-30: qty 2

## 2021-01-30 MED ORDER — ONDANSETRON 4 MG PO TBDP
4.0000 mg | ORAL_TABLET | Freq: Once | ORAL | Status: AC
  Administered 2021-01-30: 4 mg via ORAL
  Filled 2021-01-30: qty 1

## 2021-01-30 NOTE — ED Provider Notes (Signed)
Emergency Medicine Provider Triage Evaluation Note  Asiel Chrostowski , a 45 y.o. male  was evaluated in triage.  Pt complains of abdominal pain, distention, nausea, vomiting, chest pain.  History of smoking and alcohol abuse.  No fevers or chills.  Review of Systems  Positive: Abdominal distention Negative: Fever  Physical Exam  BP 132/82 (BP Location: Left Arm)   Pulse 73   Temp 97.9 F (36.6 C) (Oral)   Resp 17   SpO2 99%  Gen:   Awake, no distress   Resp:  Normal effort  MSK:   Moves extremities without difficulty  Other:  Abdominal distention telangiectasia over the abdomen  Medical Decision Making  Medically screening exam initiated at 8:38 PM.  Appropriate orders placed.  Mitul Wey was informed that the remainder of the evaluation will be completed by another provider, this initial triage assessment does not replace that evaluation, and the importance of remaining in the ED until their evaluation is complete.  Patient with abdominal pain, chest pain, labs and work-up initiated   Arthor Captain, Cordelia Poche 01/30/21 2045    Maia Plan, MD 01/30/21 2322

## 2021-01-30 NOTE — ED Triage Notes (Signed)
Patient reports RUQ pain with emesis and occasional diarrhea this week , mild abdominal distention , no fever or chills , patient stated heavy drinking .

## 2021-01-31 ENCOUNTER — Inpatient Hospital Stay (HOSPITAL_COMMUNITY): Payer: Self-pay

## 2021-01-31 ENCOUNTER — Encounter (HOSPITAL_COMMUNITY): Payer: Self-pay | Admitting: Internal Medicine

## 2021-01-31 DIAGNOSIS — K920 Hematemesis: Secondary | ICD-10-CM

## 2021-01-31 DIAGNOSIS — E7220 Disorder of urea cycle metabolism, unspecified: Secondary | ICD-10-CM

## 2021-01-31 DIAGNOSIS — J810 Acute pulmonary edema: Secondary | ICD-10-CM

## 2021-01-31 DIAGNOSIS — I81 Portal vein thrombosis: Secondary | ICD-10-CM

## 2021-01-31 DIAGNOSIS — K7031 Alcoholic cirrhosis of liver with ascites: Principal | ICD-10-CM

## 2021-01-31 DIAGNOSIS — R7401 Elevation of levels of liver transaminase levels: Secondary | ICD-10-CM

## 2021-01-31 DIAGNOSIS — J811 Chronic pulmonary edema: Secondary | ICD-10-CM | POA: Diagnosis present

## 2021-01-31 DIAGNOSIS — K7010 Alcoholic hepatitis without ascites: Secondary | ICD-10-CM | POA: Diagnosis present

## 2021-01-31 DIAGNOSIS — K7011 Alcoholic hepatitis with ascites: Secondary | ICD-10-CM

## 2021-01-31 DIAGNOSIS — D689 Coagulation defect, unspecified: Secondary | ICD-10-CM | POA: Diagnosis present

## 2021-01-31 DIAGNOSIS — F1010 Alcohol abuse, uncomplicated: Secondary | ICD-10-CM

## 2021-01-31 DIAGNOSIS — E871 Hypo-osmolality and hyponatremia: Secondary | ICD-10-CM

## 2021-01-31 HISTORY — PX: IR PARACENTESIS: IMG2679

## 2021-01-31 LAB — LACTATE DEHYDROGENASE: LDH: 196 U/L — ABNORMAL HIGH (ref 98–192)

## 2021-01-31 LAB — BODY FLUID CELL COUNT WITH DIFFERENTIAL
Eos, Fluid: 0 %
Lymphs, Fluid: 24 %
Monocyte-Macrophage-Serous Fluid: 68 % (ref 50–90)
Neutrophil Count, Fluid: 8 % (ref 0–25)
Total Nucleated Cell Count, Fluid: 74 cu mm (ref 0–1000)

## 2021-01-31 LAB — ETHANOL: Alcohol, Ethyl (B): <10 mg/dL (ref <10)

## 2021-01-31 LAB — TYPE AND SCREEN
ABO/RH(D): O POS
Antibody Screen: NEGATIVE

## 2021-01-31 LAB — HIV ANTIBODY (ROUTINE TESTING W REFLEX): HIV Screen 4th Generation wRfx: NONREACTIVE

## 2021-01-31 LAB — GRAM STAIN

## 2021-01-31 LAB — LACTATE DEHYDROGENASE, PLEURAL OR PERITONEAL FLUID: LD, Fluid: 63 U/L — ABNORMAL HIGH (ref 3–23)

## 2021-01-31 LAB — RESP PANEL BY RT-PCR (FLU A&B, COVID) ARPGX2
Influenza A by PCR: NEGATIVE
Influenza B by PCR: NEGATIVE
SARS Coronavirus 2 by RT PCR: NEGATIVE

## 2021-01-31 LAB — BRAIN NATRIURETIC PEPTIDE: B Natriuretic Peptide: 163.1 pg/mL — ABNORMAL HIGH (ref 0.0–100.0)

## 2021-01-31 LAB — ALBUMIN, PLEURAL OR PERITONEAL FLUID: Albumin, Fluid: <1 g/dL

## 2021-01-31 LAB — GAMMA GT: GGT: 445 U/L — ABNORMAL HIGH (ref 7–50)

## 2021-01-31 LAB — MAGNESIUM: Magnesium: 1.8 mg/dL (ref 1.7–2.4)

## 2021-01-31 LAB — PROTIME-INR
INR: 1.5 — ABNORMAL HIGH (ref 0.8–1.2)
Prothrombin Time: 17.8 seconds — ABNORMAL HIGH (ref 11.4–15.2)

## 2021-01-31 LAB — PROTEIN, PLEURAL OR PERITONEAL FLUID: Total protein, fluid: <3 g/dL

## 2021-01-31 LAB — ABO/RH: ABO/RH(D): O POS

## 2021-01-31 LAB — HEMOGLOBIN AND HEMATOCRIT, BLOOD
HCT: 32.4 % — ABNORMAL LOW (ref 39.0–52.0)
HCT: 34.2 % — ABNORMAL LOW (ref 39.0–52.0)
Hemoglobin: 10.9 g/dL — ABNORMAL LOW (ref 13.0–17.0)
Hemoglobin: 11.1 g/dL — ABNORMAL LOW (ref 13.0–17.0)

## 2021-01-31 LAB — GLUCOSE, PLEURAL OR PERITONEAL FLUID: Glucose, Fluid: 109 mg/dL

## 2021-01-31 LAB — LIPASE, BLOOD: Lipase: 39 U/L (ref 11–51)

## 2021-01-31 LAB — TROPONIN I (HIGH SENSITIVITY): Troponin I (High Sensitivity): 9 ng/L (ref <18)

## 2021-01-31 LAB — BILIRUBIN, DIRECT: Bilirubin, Direct: 1.6 mg/dL — ABNORMAL HIGH (ref 0.0–0.2)

## 2021-01-31 MED ORDER — CHLORDIAZEPOXIDE HCL 25 MG PO CAPS
25.0000 mg | ORAL_CAPSULE | ORAL | Status: AC
  Administered 2021-02-02 (×2): 25 mg via ORAL
  Filled 2021-01-31 (×2): qty 1

## 2021-01-31 MED ORDER — PANTOPRAZOLE SODIUM 40 MG IV SOLR
40.0000 mg | Freq: Two times a day (BID) | INTRAVENOUS | Status: DC
  Administered 2021-01-31 – 2021-02-01 (×4): 40 mg via INTRAVENOUS
  Filled 2021-01-31 (×4): qty 40

## 2021-01-31 MED ORDER — THIAMINE HCL 100 MG PO TABS
100.0000 mg | ORAL_TABLET | Freq: Every day | ORAL | Status: DC
  Administered 2021-02-01 – 2021-02-03 (×3): 100 mg via ORAL
  Filled 2021-01-31 (×3): qty 1

## 2021-01-31 MED ORDER — CHLORDIAZEPOXIDE HCL 25 MG PO CAPS
100.0000 mg | ORAL_CAPSULE | Freq: Once | ORAL | Status: AC
  Administered 2021-01-31: 100 mg via ORAL
  Filled 2021-01-31: qty 4

## 2021-01-31 MED ORDER — PREDNISOLONE 5 MG PO TABS
40.0000 mg | ORAL_TABLET | Freq: Every day | ORAL | Status: DC
  Administered 2021-01-31: 40 mg via ORAL
  Filled 2021-01-31: qty 8

## 2021-01-31 MED ORDER — ADULT MULTIVITAMIN W/MINERALS CH
1.0000 | ORAL_TABLET | Freq: Every day | ORAL | Status: DC
  Administered 2021-01-31 – 2021-02-04 (×5): 1 via ORAL
  Filled 2021-01-31 (×5): qty 1

## 2021-01-31 MED ORDER — THIAMINE HCL 100 MG/ML IJ SOLN: 100.0000 mg | Freq: Once | INTRAMUSCULAR | Status: DC

## 2021-01-31 MED ORDER — ALBUTEROL SULFATE (2.5 MG/3ML) 0.083% IN NEBU: 2.5000 mg | INHALATION_SOLUTION | Freq: Four times a day (QID) | RESPIRATORY_TRACT | Status: DC | PRN

## 2021-01-31 MED ORDER — CHLORDIAZEPOXIDE HCL 25 MG PO CAPS
25.0000 mg | ORAL_CAPSULE | Freq: Three times a day (TID) | ORAL | Status: AC
  Administered 2021-01-31 (×3): 25 mg via ORAL
  Filled 2021-01-31 (×3): qty 1

## 2021-01-31 MED ORDER — THIAMINE HCL 100 MG/ML IJ SOLN
100.0000 mg | Freq: Once | INTRAMUSCULAR | Status: AC
  Administered 2021-01-31: 100 mg via INTRAVENOUS
  Filled 2021-01-31: qty 2

## 2021-01-31 MED ORDER — CHLORDIAZEPOXIDE HCL 25 MG PO CAPS
25.0000 mg | ORAL_CAPSULE | Freq: Every day | ORAL | Status: AC
  Administered 2021-02-03: 25 mg via ORAL
  Filled 2021-01-31: qty 1

## 2021-01-31 MED ORDER — IOHEXOL 300 MG/ML  SOLN
100.0000 mL | Freq: Once | INTRAMUSCULAR | Status: AC | PRN
  Administered 2021-01-31: 100 mL via INTRAVENOUS

## 2021-01-31 MED ORDER — SODIUM CHLORIDE 0.9% FLUSH
3.0000 mL | Freq: Two times a day (BID) | INTRAVENOUS | Status: DC
  Administered 2021-01-31 – 2021-02-03 (×8): 3 mL via INTRAVENOUS

## 2021-01-31 MED ORDER — CHLORDIAZEPOXIDE HCL 25 MG PO CAPS
25.0000 mg | ORAL_CAPSULE | Freq: Three times a day (TID) | ORAL | Status: AC
  Administered 2021-02-01 (×3): 25 mg via ORAL
  Filled 2021-01-31 (×3): qty 1

## 2021-01-31 MED ORDER — CHLORDIAZEPOXIDE HCL 25 MG PO CAPS: 25.0000 mg | ORAL_CAPSULE | Freq: Four times a day (QID) | ORAL | Status: AC | PRN

## 2021-01-31 MED ORDER — ONDANSETRON HCL 4 MG/2ML IJ SOLN
4.0000 mg | Freq: Four times a day (QID) | INTRAMUSCULAR | Status: DC | PRN
  Administered 2021-01-31: 4 mg via INTRAVENOUS
  Filled 2021-01-31: qty 2

## 2021-01-31 MED ORDER — HYDROXYZINE HCL 25 MG PO TABS: 25.0000 mg | ORAL_TABLET | Freq: Four times a day (QID) | ORAL | Status: AC | PRN

## 2021-01-31 MED ORDER — ONDANSETRON HCL 4 MG PO TABS
4.0000 mg | ORAL_TABLET | Freq: Four times a day (QID) | ORAL | Status: DC | PRN
Start: 2021-01-31 — End: 2021-02-04

## 2021-01-31 MED ORDER — LOPERAMIDE HCL 2 MG PO CAPS: 2.0000 mg | ORAL_CAPSULE | ORAL | Status: AC | PRN

## 2021-01-31 MED ORDER — LIDOCAINE HCL (PF) 1 % IJ SOLN
INTRAMUSCULAR | Status: AC
  Filled 2021-01-31: qty 30

## 2021-01-31 MED ORDER — LIDOCAINE HCL 1 % IJ SOLN
INTRAMUSCULAR | Status: AC | PRN
  Administered 2021-01-31: 10 mL

## 2021-01-31 NOTE — Consult Note (Addendum)
[facility] Consult: 8:35 AM 01/31/2021  LOS: 0 days    Referring Provider: Dr Harvest Forest  Primary Care Physician:  [facility]. Primary Gastroenterologist: None.  Unassigned.   Reason for Consultation: Nausea, vomiting.  Looks to have new diagnosis of cirrhosis.   HPI: Donald Briggs is a 45 y.o. male.  PMH Bell's palsy.  Alcohol abuse/dependence.  Takes 2 Naprosyn daily for back pain.  Elevated LFTs 2015 with AST/ALT 115/61, elevated alk phos into the 200s, normal T bili.  Abdominal ultrasound of 11/2013 was unremarkable.  Presents to ED now c/o abdominal pain/distention, nonbloody N/V, intermittent loose stools.  Chest pain associated with vomiting.  No fever/chills.  Continues long-term history heavy drinking.  Not using acetaminophen.  Abdominal ultrasound: Liver cirrhosis.  Occluded portal vein with cavernous transformation of vasculature around the porta hepatis.  GB unremarkable.  5 mm CBD. CT abdomen: Cirrhosis.  Hepatic steatosis.  Early arterial effusion in right hepatic lobe suggests early vascular shunt.  No findings suspicious for HCC.  Moderate abdominal ascites.  Mild splenomegaly. 2.2 L paracentesis w fluid studies pending.  T bili 3.7.  Alk phos 241.  AST/ALT 125/30.  INR 1.5.  Ammonia 75. Hb 12.2.  Platelets 78. Trops negative x2. Na 132.  BUN/creatinine actually below normal.  Glucose 89. Acute hepatitis serologies including HCV antibody are nonreactive. Pending labs include GGT, alpha-1 antitrypsin, ANA  Physically tremulous.  Reaching CIWA score of 8.  Librium in place along with Protonix 40 mg iv bid, prednisolone, Zofran, thiamine.  Past Medical History:  Diagnosis Date  . Back pain   . Bell's palsy     History reviewed. No pertinent  surgical history.  Prior to Admission medications   Medication Sig Start Date End Date Taking? Authorizing Provider  cyclobenzaprine (FLEXERIL) 10 MG tablet Take 1 tablet (10 mg total) by mouth 3 (three) times daily as needed for muscle spasms. 12/18/18   Gildardo Pounds, NP  hydroxypropyl methylcellulose / hypromellose (ISOPTO TEARS / GONIOVISC) 2.5 % ophthalmic solution Place 1 drop into the right eye 4 (four) times daily as needed for dry eyes. Patient not taking: Reported on 01/12/2019 11/11/18   Gildardo Pounds, NP  naproxen (NAPROSYN) 375 MG tablet Take 1 tablet (375 mg total) by mouth 2 (two) times daily. 01/12/19   Gildardo Pounds, NP  pantoprazole (PROTONIX) 20 MG tablet Take 1 tablet (20 mg total) by mouth daily. Patient not taking: Reported on 09/02/2017 12/24/13   Carman Ching, PA-C    Scheduled Meds: . chlordiazePOXIDE  25 mg Oral TID AC & HS   Followed by  . [START ON 02/01/2021] chlordiazePOXIDE  25 mg Oral TID   Followed by  . [START ON 02/02/2021] chlordiazePOXIDE  25 mg Oral BH-qamhs   Followed by  . [START ON 02/03/2021] chlordiazePOXIDE  25 mg Oral Daily  . multivitamin with minerals  1 tablet Oral Daily  . prednisoLONE  40 mg Oral Daily  . sodium chloride flush  3 mL Intravenous Q12H  .  thiamine  100 mg Intravenous Once  . [START ON 02/01/2021] thiamine  100 mg Oral Daily   Infusions:  PRN Meds: albuterol, chlordiazePOXIDE, hydrOXYzine, loperamide, ondansetron **OR** ondansetron (ZOFRAN) IV   Allergies as of 01/30/2021  . (No Known Allergies)    Family History  Problem Relation Age of Onset  . Cancer Mother     Social History   Socioeconomic History  . Marital status: Married    Spouse name: Not on file  . Number of children: Not on file  . Years of education: Not on file  . Highest education level: Not on file  Occupational History  . Not on file  Tobacco Use  . Smoking status: Current Every Day Smoker  . Smokeless tobacco: Never Used  . Tobacco  comment: 2 cigarettes per day   Vaping Use  . Vaping Use: Never used  Substance and Sexual Activity  . Alcohol use: Yes  . Drug use: No  . Sexual activity: Not on file  Other Topics Concern  . Not on file  Social History Narrative  . Not on file   Social Determinants of Health   Financial Resource Strain: Not on file  Food Insecurity: Not on file  Transportation Needs: Not on file  Physical Activity: Not on file  Stress: Not on file  Social Connections: Not on file  Intimate Partner Violence: Not on file    REVIEW OF SYSTEMS: Constitutional: Weakness. ENT:  No nose bleeds Pulm: No difficulty breathing or cough. CV:  No palpitations, no LE edema.  GU:  No hematuria, no frequency GI: See HPI. Heme: Denies unusual or excessive bleeding or bruising. Transfusions: None Neuro: Positive tremors.  No headaches, no peripheral tingling or numbness.  No syncope, no seizures. Derm:  No itching, no rash or sores.  Endocrine:  No sweats or chills.  No polyuria or dysuria Immunization: Reviewed.  I do not see any records of previous COVID-19 vaccines   PHYSICAL EXAM: Vital signs in last 24 hours: Vitals:   01/31/21 0815 01/31/21 0821  BP: 116/75 116/75  Pulse: 68   Resp: 16   Temp:    SpO2: 99%    Wt Readings from Last 3 Encounters:  11/11/18 69.9 kg  09/30/18 70.5 kg  10/07/17 60.7 kg    General: Ill-appearing.  Looks old for stated age.  Able to cooperate for exam and interview. Head: No facial asymmetry or swelling.  No signs of head trauma. Eyes: Mild scleral icterus and injected sclera.  Conjunctiva erythematous. Ears: Not hard of hearing Nose: No congestion or discharge Mouth: Dental caries.  Oral mucosa is moist, pink, clear.  Tongue midline.  Smile symmetric. Neck: No JVD, no masses, no thyromegaly Lungs: A few crackles at the bases.  No labored breathing or cough  Heart: RRR.  No MRG.  Rate in the 70s on telemetry monitor. Abdomen: Soft.  Minor protuberance.   Do not appreciate splenomegaly, hepatomegaly.  No hernias.  No masses.  Bowel sounds active.  No tenderness.   Rectal: Deferred Musc/Skeltl: No joint redness swelling or gross deformity. Extremities: Slight, at most 1+, edema in the feet and ankles. Neurologic: Tremor in the hands, if asterixis is present it is mild but could be masked by resting tremor.  Moves all 4 limbs.  Follows commands easily and maintains arousal easily. Skin: Jaundiced.  Telangiectasia across the chest and a few on the arms. Tattoos: On his back, 1 of which is more crudely executed than the other Nodes:  No cervical adenopathy Psych: Cooperative, calm.  Intake/Output from previous day: No intake/output data recorded. Intake/Output this shift: No intake/output data recorded.  LAB RESULTS: Recent Labs    01/30/21 2106  WBC 7.5  HGB 12.2*  HCT 36.6*  PLT 78*   BMET Lab Results  Component Value Date   NA 132 (L) 01/30/2021   NA 142 11/11/2018   NA 141 08/09/2017   K 3.8 01/30/2021   K 4.3 11/11/2018   K 4.2 08/09/2017   CL 98 01/30/2021   CL 104 11/11/2018   CL 104 08/09/2017   CO2 26 01/30/2021   CO2 24 11/11/2018   CO2 23 08/09/2017   GLUCOSE 89 01/30/2021   GLUCOSE 111 (H) 11/11/2018   GLUCOSE 96 08/09/2017   BUN <5 (L) 01/30/2021   BUN 7 11/11/2018   BUN 10 08/09/2017   CREATININE 0.48 (L) 01/30/2021   CREATININE 0.74 (L) 11/11/2018   CREATININE 0.64 (L) 08/09/2017   CALCIUM 7.8 (L) 01/30/2021   CALCIUM 8.9 11/11/2018   CALCIUM 9.1 08/09/2017   LFT Recent Labs    01/30/21 2106  PROT 8.3*  ALBUMIN 2.0*  AST 125*  ALT 30  ALKPHOS 241*  BILITOT 3.7*   PT/INR Lab Results  Component Value Date   INR 1.5 (H) 01/30/2021   Hepatitis Panel Recent Labs    01/30/21 2106  HEPBSAG NON REACTIVE  HCVAB NON REACTIVE  HEPAIGM NON REACTIVE  HEPBIGM NON REACTIVE   C-Diff No components found for: CDIFF Lipase     Component Value Date/Time   LIPASE 41 08/09/2017 1613    Drugs of  Abuse     Component Value Date/Time   LABOPIA NONE DETECTED 12/28/2013 1808   COCAINSCRNUR NONE DETECTED 12/28/2013 1808   LABBENZ NONE DETECTED 12/28/2013 1808   AMPHETMU NONE DETECTED 12/28/2013 1808   THCU NONE DETECTED 12/28/2013 1808   LABBARB NONE DETECTED 12/28/2013 1808     RADIOLOGY STUDIES: DG Chest 2 View  Result Date: 01/30/2021 CLINICAL DATA:  Right upper quadrant pain and diarrhea. EXAM: CHEST - 2 VIEW COMPARISON:  December 24, 2013 FINDINGS: Mild, diffusely increased interstitial lung markings are seen. There is no evidence of pleural effusion or pneumothorax. Prominence of the bilateral perihilar pulmonary vasculature is seen. The heart size and mediastinal contours are within normal limits. Chronic sclerotic changes are seen along the eighth right rib. IMPRESSION: Pulmonary vascular congestion with mild interstitial edema. Electronically Signed   By: Virgina Norfolk M.D.   On: 01/30/2021 21:18   US ABDOMEN LIMITED RUQ (LIVER/GB)  Result Date: 01/30/2021 CLINICAL DATA:  Right upper quadrant abdominal pain EXAM: ULTRASOUND ABDOMEN LIMITED RIGHT UPPER QUADRANT COMPARISON:  12/24/2013 FINDINGS: Gallbladder: No gallstones or wall thickening visualized. No sonographic Murphy sign noted by sonographer. Common bile duct: Diameter: 5 mm. Liver: No focal lesion identified. Coarsened, heterogeneous echotexture with nodular hepatic surface contour. Portal vein appears occluded. There are patent vascular structures seen adjacent to the portal vein within the porta hepatis which may reflect cavernous transformation. Other: Moderate volume ascites. IMPRESSION: 1. Cirrhotic hepatic morphology. No focal liver lesion is identified. 2. Portal vein appears occluded. Adjacent patent vascular structures within the porta hepatis, possibly reflecting cavernous transformation. Findings can be seen in the setting of chronic portal hypertension. Consider dedicated hepatic protocol CT of the abdomen for further  evaluation, as clinically indicated. 3. Unremarkable ultrasound of the gallbladder. Electronically Signed   By: Davina Poke D.O.   On: 01/30/2021 21:54     IMPRESSION:   *  Decompensated alcoholic liver disease.  Cirrhosis.  In pt w chronic ETOH abuse/dependence. Discr Fx score is ~ 23 to 24 which does not meet criteria for Rx w Prednisolone.  Score of >/+ to 32 is marker for treatment.   Acute hepatitis serologies are negative.   Alk phos is elevated so labs to r/o AIH are pending.    *   Portal vein occlusion with cavernous transformation.  C/W chronic portal hypertension.  No role for anticoagulation.  *   Ascites.  Status post 2.2 L paracentesis with pending fluid studies.  *    Thrombocytopenia.  *    Coagulopathy.  *   Noncardiac chest pain.  Nausea, nonbloody emesis.  Sounds like esophagitis.  *   Mildly elevated ammonia level.   PLAN:      *    Await fluid studies from the paracentesis earlier  *     No role for anticoagulation.  *    Agree with 2 g sodium diet if tolerated.  Would regressed to clear or full liquids if he has recurrent nausea or vomiting  *    Leave the IV Protonix in place until he proves that he can tolerate p.o. consistently.  *     Supportive care.  *     Needs complete and total alcohol abstinence.  *     Will need EGD to assess for portal hypertensive gastropathy, varices at some point.  *    Alpha-fetoprotein, mitochondrial/smooth muscle antibody, IgG, hepatitis B surface antibody, Hep A total.    *     Stopped prednisolone as not meeting criteria for its use.Azucena Freed  01/31/2021, 8:35 AM Phone [phone]     Attending Physician Note   I have taken a history, examined the patient and reviewed the chart. I agree with the Advanced Practitioner's note, impression and recommendations.  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

## 2021-01-31 NOTE — ED Notes (Signed)
Dr. Smith at bedside.

## 2021-01-31 NOTE — ED Notes (Signed)
Transported to IR 

## 2021-01-31 NOTE — Procedures (Signed)
Ultrasound-guided diagnostic and therapeutic paracentesis performed yielding 2.2 liters of straw colored fluid.  Fluid was sent to lab for analysis. No immediate complications. EBL is none. ° °

## 2021-01-31 NOTE — ED Provider Notes (Signed)
[facility] EMERGENCY DEPARTMENT Provider Note   CSN: [id number] Arrival date & time: 01/30/21  1746     History Chief Complaint  Patient presents with  . Abdominal Pain    Donald Briggs is a 45 y.o. male.  45 yo M with a chief complaints of upper abdominal pain nausea and vomiting.  Going on for about a week or so.  Has been drinking beer fairly heavily this week and thinks maybe that is the cause of it.  No fevers.  Feels like the pain goes up into his chest sometimes when he vomits.  Denies any Tylenol.  Denies taking any over-the-counter medications.  Denies diarrhea.  The history is provided by the patient.  Abdominal Pain Pain location:  RUQ Pain quality: aching and sharp   Pain radiates to:  Does not radiate Pain severity:  Moderate Onset quality:  Gradual Duration:  1 week Timing:  Constant Progression:  Worsening Chronicity:  New Context: alcohol use   Relieved by:  Nothing Worsened by:  Nothing Ineffective treatments:  None tried Associated symptoms: nausea and vomiting   Associated symptoms: no chest pain, no chills, no diarrhea, no fever and no shortness of breath        Past Medical History:  Diagnosis Date  . Back pain   . Bell's palsy     Patient Active Problem List   Diagnosis Date Noted  . Tobacco dependence 08/14/2017    History reviewed. No pertinent surgical history.     Family History  Problem Relation Age of Onset  . Cancer Mother     Social History   Tobacco Use  . Smoking status: Current Every Day Smoker  . Smokeless tobacco: Never Used  . Tobacco comment: 2 cigarettes per day   Vaping Use  . Vaping Use: Never used  Substance Use Topics  . Alcohol use: Yes  . Drug use: No    Home Medications Prior to Admission medications   Medication Sig Start Date End Date Taking? Authorizing Provider  cyclobenzaprine (FLEXERIL) 10 MG tablet Take 1 tablet (10 mg total) by mouth 3 (three) times daily as needed for  muscle spasms. 12/18/18   Claiborne Rigg, NP  hydroxypropyl methylcellulose / hypromellose (ISOPTO TEARS / GONIOVISC) 2.5 % ophthalmic solution Place 1 drop into the right eye 4 (four) times daily as needed for dry eyes. Patient not taking: Reported on 01/12/2019 11/11/18   Claiborne Rigg, NP  naproxen (NAPROSYN) 375 MG tablet Take 1 tablet (375 mg total) by mouth 2 (two) times daily. 01/12/19   Claiborne Rigg, NP  pantoprazole (PROTONIX) 20 MG tablet Take 1 tablet (20 mg total) by mouth daily. Patient not taking: Reported on 09/02/2017 12/24/13   Kathrynn Speed, PA-C    Allergies    Patient has no known allergies.  Review of Systems   Review of Systems  Constitutional: Negative for chills and fever.  HENT: Negative for congestion and facial swelling.   Eyes: Negative for discharge and visual disturbance.  Respiratory: Negative for shortness of breath.   Cardiovascular: Negative for chest pain and palpitations.  Gastrointestinal: Positive for abdominal pain, nausea and vomiting. Negative for diarrhea.  Musculoskeletal: Negative for arthralgias and myalgias.  Skin: Negative for color change and rash.  Neurological: Negative for tremors, syncope and headaches.  Psychiatric/Behavioral: Negative for confusion and dysphoric mood.    Physical Exam Updated Vital Signs BP 117/80   Pulse 62   Temp 98.4 F (36.9 C) (Oral)  Resp 16   SpO2 95%   Physical Exam Vitals and nursing note reviewed.  Constitutional:      Appearance: He is well-developed.  HENT:     Head: Normocephalic and atraumatic.  Eyes:     Pupils: Pupils are equal, round, and reactive to light.  Neck:     Vascular: No JVD.  Cardiovascular:     Rate and Rhythm: Normal rate and regular rhythm.     Heart sounds: No murmur heard. No friction rub. No gallop.   Pulmonary:     Effort: No respiratory distress.     Breath sounds: No wheezing.  Abdominal:     General: There is no distension.     Tenderness: There is  abdominal tenderness. There is no guarding or rebound.     Comments: Positive fluid wave, distended.  Dull to percussion.  No significant abdominal tenderness.  No obvious palpable liver.  Musculoskeletal:        General: Normal range of motion.     Cervical back: Normal range of motion and neck supple.  Skin:    Coloration: Skin is not pale.     Findings: No rash.  Neurological:     Mental Status: He is alert and oriented to person, place, and time.  Psychiatric:        Behavior: Behavior normal.     ED Results / Procedures / Treatments   Labs (all labs ordered are listed, but only abnormal results are displayed) Labs Reviewed  COMPREHENSIVE METABOLIC PANEL - Abnormal; Notable for the following components:      Result Value   Sodium 132 (*)    BUN <5 (*)    Creatinine, Ser 0.48 (*)    Calcium 7.8 (*)    Total Protein 8.3 (*)    Albumin 2.0 (*)    AST 125 (*)    Alkaline Phosphatase 241 (*)    Total Bilirubin 3.7 (*)    All other components within normal limits  AMMONIA - Abnormal; Notable for the following components:   Ammonia 75 (*)    All other components within normal limits  CBC - Abnormal; Notable for the following components:   RBC 3.83 (*)    Hemoglobin 12.2 (*)    HCT 36.6 (*)    RDW 15.9 (*)    Platelets 78 (*)    All other components within normal limits  PROTIME-INR - Abnormal; Notable for the following components:   Prothrombin Time 17.8 (*)    INR 1.5 (*)    All other components within normal limits  RESP PANEL BY RT-PCR (FLU A&B, COVID) ARPGX2  HEPATITIS PANEL, ACUTE  TROPONIN I (HIGH SENSITIVITY)  TROPONIN I (HIGH SENSITIVITY)    EKG EKG Interpretation  Date/Time:  Monday January 30 2021 20:56:38 EDT Ventricular Rate:  60 PR Interval:  154 QRS Duration: 86 QT Interval:  458 QTC Calculation: 458 R Axis:   -16 Text Interpretation: Normal sinus rhythm with sinus arrhythmia Moderate voltage criteria for LVH, may be normal variant ( R in aVL ,  [employer] product ) Possible Anterolateral infarct , age undetermined Abnormal ECG deep inverted t waves in lead II, III seen on prior but more pronounced Otherwise no significant change Confirmed by Melene Plan [phone]) on 01/31/2021 3:50:23 AM   Radiology DG Chest 2 View  Result Date: 01/30/2021 CLINICAL DATA:  Right upper quadrant pain and diarrhea. EXAM: CHEST - 2 VIEW COMPARISON:  December 24, 2013 FINDINGS: Mild, diffusely increased interstitial lung markings  are seen. There is no evidence of pleural effusion or pneumothorax. Prominence of the bilateral perihilar pulmonary vasculature is seen. The heart size and mediastinal contours are within normal limits. Chronic sclerotic changes are seen along the eighth right rib. IMPRESSION: Pulmonary vascular congestion with mild interstitial edema. Electronically Signed   By: Aram Candelahaddeus  Houston M.D.   On: 01/30/2021 21:18   US ABDOMEN LIMITED RUQ (LIVER/GB)  Result Date: 01/30/2021 CLINICAL DATA:  Right upper quadrant abdominal pain EXAM: ULTRASOUND ABDOMEN LIMITED RIGHT UPPER QUADRANT COMPARISON:  12/24/2013 FINDINGS: Gallbladder: No gallstones or wall thickening visualized. No sonographic Murphy sign noted by sonographer. Common bile duct: Diameter: 5 mm. Liver: No focal lesion identified. Coarsened, heterogeneous echotexture with nodular hepatic surface contour. Portal vein appears occluded. There are patent vascular structures seen adjacent to the portal vein within the porta hepatis which may reflect cavernous transformation. Other: Moderate volume ascites. IMPRESSION: 1. Cirrhotic hepatic morphology. No focal liver lesion is identified. 2. Portal vein appears occluded. Adjacent patent vascular structures within the porta hepatis, possibly reflecting cavernous transformation. Findings can be seen in the setting of chronic portal hypertension. Consider dedicated hepatic protocol CT of the abdomen for further evaluation, as clinically indicated. 3. Unremarkable  ultrasound of the gallbladder. Electronically Signed   By: Duanne GuessNicholas  Plundo D.O.   On: 01/30/2021 21:54    Procedures Procedures   Medications Ordered in ED Medications  ibuprofen (ADVIL) tablet 800 mg (800 mg Oral Given 01/30/21 2047)  ondansetron (ZOFRAN-ODT) disintegrating tablet 4 mg (4 mg Oral Given 01/30/21 2047)  chlordiazePOXIDE (LIBRIUM) capsule 100 mg (100 mg Oral Given 01/31/21 0443)    ED Course  I have reviewed the triage vital signs and the nursing notes.  Pertinent labs & imaging results that were available during my care of the patient were reviewed by me and considered in my medical decision making (see chart for details).    MDM Rules/Calculators/A&P                          45 yo M with chief complaints of abdominal pain. US with concerns for cirrhosis.  Patient is known alcoholic and has been drinking heavily recently.  He does have elevated LFTs and his total bili is 3.7, his INR is also elevated.  Will discuss with GI.  I discussed case with Dr. Myrtie Neitheranis.  They will come evaluate the patient at bedside this morning.  Recommended admission to the hospitalist service.  Likely will need fluid studies performed today and likely starting diuretic therapy.  The patients results and plan were reviewed and discussed.   Any x-rays performed were independently reviewed by myself.   Differential diagnosis were considered with the presenting HPI.  Medications  ibuprofen (ADVIL) tablet 800 mg (800 mg Oral Given 01/30/21 2047)  ondansetron (ZOFRAN-ODT) disintegrating tablet 4 mg (4 mg Oral Given 01/30/21 2047)  chlordiazePOXIDE (LIBRIUM) capsule 100 mg (100 mg Oral Given 01/31/21 0443)    Vitals:   01/30/21 1848 01/30/21 2321 01/31/21 0224 01/31/21 0415  BP: 132/82 (!) 146/94 138/86 117/80  Pulse: 73 68 67 62  Resp: 17 18 18 16   Temp: 97.9 F (36.6 C) 98.4 F (36.9 C) 98.4 F (36.9 C)   TempSrc: Oral Oral Oral   SpO2: 99% 100% 99% 95%    Final diagnoses:  RUQ abdominal  pain  Alcoholic cirrhosis of liver with ascites (HCC)    Admission/ observation were discussed with the admitting physician, patient and/or family and  they are comfortable with the plan.   Final Clinical Impression(s) / ED Diagnoses Final diagnoses:  RUQ abdominal pain  Alcoholic cirrhosis of liver with ascites [facility])    Rx / DC Orders ED Discharge Orders    None       Melene Plan, DO 01/31/21 2992

## 2021-01-31 NOTE — ED Notes (Signed)
Patient transported to CT 

## 2021-01-31 NOTE — H&P (Addendum)
History and Physical    Donald Briggs RWE:[id number] DOB: 01/08/1976 DOA: 01/30/2021  Referring MD/NP/PA: Trey Paula, MD PCP: Claiborne Rigg, NP  Patient coming from: Home  Chief Complaint: Abdominal pain  I have personally briefly reviewed patient's old medical records in [facility]   HPI: Donald Briggs is a 45 y.o. Spanish-speaking male with medical history significant of back pain, alcohol abuse, and tobacco use who presents with complaints of progressively worsening abdominal pain over the last 1 week.  History obtained with use of interpreter services.  Pain is sharp and located in the right upper quadrant.  Notes associated symptoms of left-sided chest pain,  shortness of breath, eye redness, nausea, vomiting, and intermittent blood in stools.  He last vomited yesterday and reported that emesis has been coffee-ground colored in appearance at times. Denies having any significant fever, rash, leg swelling, or calf pain. Patient admits to drinking 6 beers per day on average and his last drink was 2 days ago.  He also smokes 4 to 5 cigarettes/day on average.    ED Course: Upon admission into the emergency department patient was seen to be afebrile with blood pressure 117/82-146/94, and all other vital signs maintained.  Significant for hemoglobin 12.2, platelets 78, sodium 132, calcium 7.8, albumin 2, alkaline phosphatase 241, AST 125, ALT 30, total bilirubin 3.7, ammonia 75, and PT/INR 17.8/1.5.  Acute hepatitis panel was nonreactive.  Chest x-ray showed pulmonary vascular congestion with mild interstitial edema.  Right upper quadrant ultrasound showed cirrhotic hepatic morphology and portal vein appeared occluded.  Patient was given Librium 100 mg p.o., ibuprofen 800 mg p.o., and Zofran.  GI was consulted.  TRH called to admit.  Review of Systems  Constitutional: Positive for malaise/fatigue. Negative for fever.  HENT: Negative for nosebleeds and sinus pain.   Eyes: Positive  for redness. Negative for pain.  Respiratory: Positive for shortness of breath.   Cardiovascular: Positive for chest pain. Negative for leg swelling.  Gastrointestinal: Positive for abdominal pain, blood in stool, nausea and vomiting.  Genitourinary: Negative for dysuria and hematuria.  Musculoskeletal: Positive for joint pain. Negative for falls.  Neurological: Negative for loss of consciousness and weakness.  Psychiatric/Behavioral: Positive for substance abuse. Negative for memory loss.    Past Medical History:  Diagnosis Date  . Back pain   . Bell's palsy     History reviewed. No pertinent surgical history.   reports that he has been smoking. He has never used smokeless tobacco. He reports current alcohol use. He reports that he does not use drugs.  No Known Allergies  Family History  Problem Relation Age of Onset  . Cancer Mother     Prior to Admission medications   Medication Sig Start Date End Date Taking? Authorizing Provider  cyclobenzaprine (FLEXERIL) 10 MG tablet Take 1 tablet (10 mg total) by mouth 3 (three) times daily as needed for muscle spasms. 12/18/18   Claiborne Rigg, NP  hydroxypropyl methylcellulose / hypromellose (ISOPTO TEARS / GONIOVISC) 2.5 % ophthalmic solution Place 1 drop into the right eye 4 (four) times daily as needed for dry eyes. Patient not taking: Reported on 01/12/2019 11/11/18   Claiborne Rigg, NP  naproxen (NAPROSYN) 375 MG tablet Take 1 tablet (375 mg total) by mouth 2 (two) times daily. 01/12/19   Claiborne Rigg, NP  pantoprazole (PROTONIX) 20 MG tablet Take 1 tablet (20 mg total) by mouth daily. Patient not taking: Reported on 09/02/2017 12/24/13   Kathrynn Speed,  PA-C    Physical Exam:  Constitutional: Middle-age male who appears to be in some distress Vitals:   01/30/21 1848 01/30/21 2321 01/31/21 0224 01/31/21 0415  BP: 132/82 (!) 146/94 138/86 117/80  Pulse: 73 68 67 62  Resp: 17 18 18 16   Temp: 97.9 F (36.6 C) 98.4 F (36.9  C) 98.4 F (36.9 C)   TempSrc: Oral Oral Oral   SpO2: 99% 100% 99% 95%   Eyes: PERRL, conjunctival injection with scleral icterus appreciated. ENMT: Mucous membranes are moist. Posterior pharynx clear of any exudate or lesions. Neck: normal, supple, no masses, no thyromegaly.  JVD present to the angle of the jaw. Respiratory: Intermittent crackles appreciated.  No significant wheezing or rhonchi appreciated.  Patient currently maintaining O2 saturations on room air. Cardiovascular: Regular rate and rhythm, no murmurs / rubs / gallops. No extremity edema. 2+ pedal pulses. No carotid bruits.  Abdomen: Abdominal distention with positive fluid wave.  Tenderness to palpation noted of the right upper quadrant. Musculoskeletal: no clubbing / cyanosis. No joint deformity upper and lower extremities. Good ROM, no contractures. Normal muscle tone.  Skin: Spider angiomas present of the upper chest and back Neurologic: CN 2-12 grossly intact. Sensation intact, DTR normal. Strength 5/5 in all 4.  Tremor present. Psychiatric: Normal judgment and insight. Alert and oriented x 3. Normal mood.     Labs on Admission: I have personally reviewed following labs and imaging studies  CBC: Recent Labs  Lab 01/30/21 2106  WBC 7.5  HGB 12.2*  HCT 36.6*  MCV 95.6  PLT 78*   Basic Metabolic Panel: Recent Labs  Lab 01/30/21 2106  NA 132*  K 3.8  CL 98  CO2 26  GLUCOSE 89  BUN <5*  CREATININE 0.48*  CALCIUM 7.8*   GFR: CrCl cannot be calculated (Unknown ideal weight.). Liver Function Tests: Recent Labs  Lab 01/30/21 2106  AST 125*  ALT 30  ALKPHOS 241*  BILITOT 3.7*  PROT 8.3*  ALBUMIN 2.0*   No results for input(s): LIPASE, AMYLASE in the last 168 hours. Recent Labs  Lab 01/30/21 2106  AMMONIA 75*   Coagulation Profile: Recent Labs  Lab 01/30/21 2352  INR 1.5*   Cardiac Enzymes: No results for input(s): CKTOTAL, CKMB, CKMBINDEX, TROPONINI in the last 168 hours. BNP (last 3  results) No results for input(s): PROBNP in the last 8760 hours. HbA1C: No results for input(s): HGBA1C in the last 72 hours. CBG: No results for input(s): GLUCAP in the last 168 hours. Lipid Profile: No results for input(s): CHOL, HDL, LDLCALC, TRIG, CHOLHDL, LDLDIRECT in the last 72 hours. Thyroid Function Tests: No results for input(s): TSH, T4TOTAL, FREET4, T3FREE, THYROIDAB in the last 72 hours. Anemia Panel: No results for input(s): VITAMINB12, FOLATE, FERRITIN, TIBC, IRON, RETICCTPCT in the last 72 hours. Urine analysis: No results found for: COLORURINE, APPEARANCEUR, LABSPEC, PHURINE, GLUCOSEU, HGBUR, BILIRUBINUR, KETONESUR, PROTEINUR, UROBILINOGEN, NITRITE, LEUKOCYTESUR Sepsis Labs: No results found for this or any previous visit (from the past 240 hour(s)).   Radiological Exams on Admission: DG Chest 2 View  Result Date: 01/30/2021 CLINICAL DATA:  Right upper quadrant pain and diarrhea. EXAM: CHEST - 2 VIEW COMPARISON:  December 24, 2013 FINDINGS: Mild, diffusely increased interstitial lung markings are seen. There is no evidence of pleural effusion or pneumothorax. Prominence of the bilateral perihilar pulmonary vasculature is seen. The heart size and mediastinal contours are within normal limits. Chronic sclerotic changes are seen along the eighth right rib. IMPRESSION: Pulmonary vascular congestion  with mild interstitial edema. Electronically Signed   By: Aram Candela M.D.   On: 01/30/2021 21:18   US ABDOMEN LIMITED RUQ (LIVER/GB)  Result Date: 01/30/2021 CLINICAL DATA:  Right upper quadrant abdominal pain EXAM: ULTRASOUND ABDOMEN LIMITED RIGHT UPPER QUADRANT COMPARISON:  12/24/2013 FINDINGS: Gallbladder: No gallstones or wall thickening visualized. No sonographic Murphy sign noted by sonographer. Common bile duct: Diameter: 5 mm. Liver: No focal lesion identified. Coarsened, heterogeneous echotexture with nodular hepatic surface contour. Portal vein appears occluded. There are  patent vascular structures seen adjacent to the portal vein within the porta hepatis which may reflect cavernous transformation. Other: Moderate volume ascites. IMPRESSION: 1. Cirrhotic hepatic morphology. No focal liver lesion is identified. 2. Portal vein appears occluded. Adjacent patent vascular structures within the porta hepatis, possibly reflecting cavernous transformation. Findings can be seen in the setting of chronic portal hypertension. Consider dedicated hepatic protocol CT of the abdomen for further evaluation, as clinically indicated. 3. Unremarkable ultrasound of the gallbladder. Electronically Signed   By: Duanne Guess D.O.   On: 01/30/2021 21:54    EKG: Independently reviewed.  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  Positive fluid wave on physical exam.  Labs significant for AST 125, ALT 30, PT 17.8, and total bilirubin 3.7. Right upper quadrant ultrasound gave concern for cirrhotic hepatic morphology with no focal liver lesion identified, moderate ascites, and concern for portal vein occlusion.  Acute hepatitis panel was negative.  There are no acute concerns for infection noted at this time.  Maddrey's discriminant  function for alcoholic hepatitis score equals 25.8 using PT control of 13.  MELD Score equals 20. -Admit to a medical telemetry bed -Low-sodium diet -Check GGT, alpha-1 antitrypsin, ANA, BNP(may need to check an echocardiogram) -Therapeutic and diagnostic US paracentesis ordered (check cell count and differential, albumin, total protein, glucose, LDH, gram stain, and fluid culture) -Will need to start furosemide and spironolactone per GI -Avoid NSAIDs -Appreciate GI consultative services,  will follow-up for further  recommendation  Suspect hematemesis with nausea  blood in stools: Acute.  Patient reports intermittently seeing coffee-ground material and blood in stools.   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  Initial ultrasound of the abdomen gave concern for portal vein appeared to be occluded.  In the setting of cirrhosis -Check CT liver abdomen with and without contrast -Anticoagulation appears to be currently contraindicated given concern for possible bleeding  Pulmonary edema: Acute.  On physical exam patient did have some intermittent crackles appreciated on lung exam.  Chest x-ray significant for pulmonary edema.  Currently patient maintaining O2 saturations on room air. -Follow-up BNP -May warrant need of echocardiogram  Hyperammonemia: Acute.  Ammonia level elevated at 73.  Patient appears to be alert and oriented x3 at this time. -Consider need of initiation of lactulose  Alcohol abuse: Patient admits to drinking 6 beers per day on average.  Last drink was 2 days ago.  Patient with mild tremor on exam.  He had received 100 mg of Librium p.o. earlier this morning.  -Add-on alcohol level -CIWA protocols utilizing Librium -Thiamine and multivitamin -Continue counseling on the need of cessation of alcohol   Normocytic anemia: Hemoglobin  12.2 g/dL on admission. -Continue to monitor H&H\  Hyponatremia: Acute.  Sodium level 132 on admission.  Suspect likely related to patient's alcohol abuse along with nausea and vomiting symptoms. -Continue to monitor sodium level  Transaminitis and hyperbilirubinemia: Acute.  Patient alkaline phosphatase 241 and AST to ALT ratio consistent with alcohol abuse.  Total bilirubin elevated at 3.7. -Check direct bilirubin   Thrombocytopenia: Acute on chronic.  Platelet count 78, but had been previously done in the past back in 2015. -Continue  to monitor platelet count  Coagulopathy: Acute.  PT 17.8 and INR 1.5.    DVT prophylaxis: SCDs Code Status: Full Family Communication: Family to be updated over the phone Disposition Plan: Home Consults called: GI Admission status: Inpatient, require more than 2 midnight stay  Clydie Braun MD [facility]   If 7PM-7AM, please contact night-coverage   01/31/2021, 7:13 AM

## 2021-01-31 NOTE — ED Notes (Addendum)
Nursing Round, CIWA, assessment completed with interpretor Blossom Hoops #[id number]   Pt reports drinking 6 beers/day, last drank Sunday. Pt with headache, nausea and obvious hand tremors.  CIWA originally 5 but after Dr. Lonn Georgia assessment pt now endorses some nausea for several days.  Corrected CIWA 8

## 2021-02-01 DIAGNOSIS — K7031 Alcoholic cirrhosis of liver with ascites: Secondary | ICD-10-CM

## 2021-02-01 LAB — COMPREHENSIVE METABOLIC PANEL
ALT: 25 U/L (ref 0–44)
AST: 83 U/L — ABNORMAL HIGH (ref 15–41)
Albumin: 1.7 g/dL — ABNORMAL LOW (ref 3.5–5.0)
Alkaline Phosphatase: 200 U/L — ABNORMAL HIGH (ref 38–126)
Anion gap: 5 (ref 5–15)
BUN: 5 mg/dL — ABNORMAL LOW (ref 6–20)
CO2: 28 mmol/L (ref 22–32)
Calcium: 7.9 mg/dL — ABNORMAL LOW (ref 8.9–10.3)
Chloride: 103 mmol/L (ref 98–111)
Creatinine, Ser: 0.54 mg/dL — ABNORMAL LOW (ref 0.61–1.24)
GFR, Estimated: >60 mL/min (ref >60)
Glucose, Bld: 104 mg/dL — ABNORMAL HIGH (ref 70–99)
Potassium: 3.4 mmol/L — ABNORMAL LOW (ref 3.5–5.1)
Sodium: 136 mmol/L (ref 135–145)
Total Bilirubin: 3.7 mg/dL — ABNORMAL HIGH (ref 0.3–1.2)
Total Protein: 7.4 g/dL (ref 6.5–8.1)

## 2021-02-01 LAB — ALPHA-1-ANTITRYPSIN: A-1 Antitrypsin, Ser: 215 mg/dL — ABNORMAL HIGH (ref 101–187)

## 2021-02-01 LAB — CBC
HCT: 32.2 % — ABNORMAL LOW (ref 39.0–52.0)
Hemoglobin: 10.9 g/dL — ABNORMAL LOW (ref 13.0–17.0)
MCH: 32 pg (ref 26.0–34.0)
MCHC: 33.9 g/dL (ref 30.0–36.0)
MCV: 94.4 fL (ref 80.0–100.0)
Platelets: UNDETERMINED 10*3/uL (ref 150–400)
RBC: 3.41 MIL/uL — ABNORMAL LOW (ref 4.22–5.81)
RDW: 15.6 % — ABNORMAL HIGH (ref 11.5–15.5)
WBC: 6.7 10*3/uL (ref 4.0–10.5)
nRBC: 0 % (ref 0.0–0.2)

## 2021-02-01 LAB — CYTOLOGY - NON PAP

## 2021-02-01 LAB — HEMOGLOBIN AND HEMATOCRIT, BLOOD
HCT: 32.4 % — ABNORMAL LOW (ref 39.0–52.0)
Hemoglobin: 10.6 g/dL — ABNORMAL LOW (ref 13.0–17.0)

## 2021-02-01 LAB — MAGNESIUM: Magnesium: 1.9 mg/dL (ref 1.7–2.4)

## 2021-02-01 LAB — HEPATITIS B SURFACE ANTIBODY,QUALITATIVE: Hep B S Ab: NONREACTIVE

## 2021-02-01 LAB — HEPATITIS A ANTIBODY, TOTAL: hep A Total Ab: REACTIVE — AB

## 2021-02-01 LAB — ANA: Anti Nuclear Antibody (ANA): POSITIVE — AB

## 2021-02-01 MED ORDER — POTASSIUM CHLORIDE CRYS ER 20 MEQ PO TBCR
40.0000 meq | EXTENDED_RELEASE_TABLET | Freq: Once | ORAL | Status: AC
  Administered 2021-02-01: 40 meq via ORAL
  Filled 2021-02-01: qty 2

## 2021-02-01 MED ORDER — DIPHENHYDRAMINE HCL 25 MG PO CAPS: 25.0000 mg | ORAL_CAPSULE | Freq: Every evening | ORAL | Status: DC | PRN

## 2021-02-01 MED ORDER — FUROSEMIDE 10 MG/ML IJ SOLN
40.0000 mg | Freq: Every day | INTRAMUSCULAR | Status: DC
  Administered 2021-02-01 – 2021-02-04 (×4): 40 mg via INTRAVENOUS
  Filled 2021-02-01 (×4): qty 4

## 2021-02-01 MED ORDER — TRAZODONE HCL 50 MG PO TABS
50.0000 mg | ORAL_TABLET | Freq: Once | ORAL | Status: AC
  Administered 2021-02-01: 50 mg via ORAL
  Filled 2021-02-01: qty 1

## 2021-02-01 MED ORDER — SPIRONOLACTONE 25 MG PO TABS
50.0000 mg | ORAL_TABLET | Freq: Every day | ORAL | Status: DC
  Administered 2021-02-01 – 2021-02-04 (×4): 50 mg via ORAL
  Filled 2021-02-01 (×4): qty 2

## 2021-02-01 NOTE — Plan of Care (Signed)
  Problem: Health Behavior/Discharge Planning: Goal: Ability to manage health-related needs will improve Outcome: Progressing   Problem: Clinical Measurements: Goal: Will remain free from infection Outcome: Progressing   Problem: Nutrition: Goal: Adequate nutrition will be maintained Outcome: Progressing   

## 2021-02-01 NOTE — H&P (View-Only) (Signed)
Daily Rounding Note  02/01/2021, 9:36 AM  LOS: 1 day   SUBJECTIVE:   Chief complaint: decompensated ETOH liver dz.  New Dx cirrhosis.        BPs remain soft.  No tachycardia.  Excellent sats on RA.   Reports brown stool yesterday, no bowel movements yet today.  No abdominal pain.  No nausea or vomiting on 2 g sodium diet.  OBJECTIVE:         Vital signs in last 24 hours:    Temp:  [98 F (36.7 C)-98.7 F (37.1 C)] 98.2 F (36.8 C) (06/08 0528) Pulse Rate:  [57-84] 57 (06/08 0528) Resp:  [16-23] 18 (06/08 0528) BP: (97-132)/(61-89) 97/61 (06/08 0528) SpO2:  [96 %-100 %] 97 % (06/08 0528)   There were no vitals filed for this visit. General: resting comfortably in bed.  Looks better than he did yesterday but still looks PO and jaundiced. Heart: RRR. Chest: No labored breathing or cough.  A few crackles in the left base. Abdomen: Soft without distention.  Liver edge palpable ~ 12 cm below costal margin, nontender.  Normal, active bowel sounds. Extremities: No CCE. Neuro/Psych: No asterixis, no tremors.  Moves all 4 limbs.  Oriented x3.  Intake/Output from previous day: 06/07 0701 - 06/08 0700 In: 440 [P.O.:440] Out: -   Intake/Output this shift: No intake/output data recorded.  Lab Results: Recent Labs    01/30/21 2106 01/31/21 1111 01/31/21 1915 02/01/21 0450  WBC 7.5  --   --  6.7  HGB 12.2* 10.9* 11.1* 10.9*  10.6*  HCT 36.6* 32.4* 34.2* 32.2*  32.4*  PLT 78*  --   --  PLATELET CLUMPS NOTED ON SMEAR, UNABLE TO ESTIMATE   BMET Recent Labs    01/30/21 2106 02/01/21 0450  NA 132* 136  K 3.8 3.4*  CL 98 103  CO2 26 28  GLUCOSE 89 104*  BUN <5* 5*  CREATININE 0.48* 0.54*  CALCIUM 7.8* 7.9*   LFT Recent Labs    01/30/21 2106 01/31/21 0804 02/01/21 0450  PROT 8.3*  --  7.4  ALBUMIN 2.0*  --  1.7*  AST 125*  --  83*  ALT 30  --  25  ALKPHOS 241*  --  200*  BILITOT 3.7*  --  3.7*  BILIDIR   --  1.6*  --    PT/INR Recent Labs    01/30/21 2352  LABPROT 17.8*  INR 1.5*   Hepatitis Panel Recent Labs    01/30/21 2106  HEPBSAG NON REACTIVE  HCVAB NON REACTIVE  HEPAIGM NON REACTIVE  HEPBIGM NON REACTIVE    Studies/Results: DG Chest 2 View  Result Date: 01/30/2021 CLINICAL DATA:  Right upper quadrant pain and diarrhea. EXAM: CHEST - 2 VIEW COMPARISON:  December 24, 2013 FINDINGS: Mild, diffusely increased interstitial lung markings are seen. There is no evidence of pleural effusion or pneumothorax. Prominence of the bilateral perihilar pulmonary vasculature is seen. The heart size and mediastinal contours are within normal limits. Chronic sclerotic changes are seen along the eighth right rib. IMPRESSION: Pulmonary vascular congestion with mild interstitial edema. Electronically Signed   By: Aram Candela M.D.   On: 01/30/2021 21:18   CT LIVER ABDOMEN W WO CONTRAST  Result Date: 01/31/2021 CLINICAL DATA:  Cirrhosis, evaluate for portal vein thrombosis EXAM: CT ABDOMEN WITHOUT AND WITH CONTRAST TECHNIQUE: Multidetector CT imaging of the abdomen was performed following the standard protocol before and following the bolus  administration of intravenous contrast. CONTRAST:  OMNIPAQUE IOHEXOL 300 MG/ML  SOLN COMPARISON:  Right upper quadrant ultrasound dated 01/30/2021 FINDINGS: Lower chest: Lung bases are clear. Hepatobiliary: Cirrhosis. Mild hepatic steatosis. Heterogeneous early arterial perfusion in the posterior right hepatic lobe, suggesting an early vascular shunt to this region, possibly iatrogenic. No suspicious/enhancing lesions. Gallbladder is unremarkable. No intrahepatic or extrahepatic ductal dilatation. Pancreas: Within normal limits. Spleen: Mildly enlarged, measuring 16.5 cm in maximal craniocaudal dimension. Adrenals/Urinary Tract: Adrenal glands are within normal limits. Kidneys are within normal limits.  No hydronephrosis. Stomach/Bowel: Stomach is within normal  limits. Visualized bowel is grossly unremarkable. Vascular/Lymphatic: No evidence of abdominal aortic aneurysm. Patent portal vein, noting mixing artifact in the early arterial phase. Other: Moderate abdominal ascites. Musculoskeletal: Degenerative changes of the visualized thoracolumbar spine. IMPRESSION: Patent portal vein. Cirrhosis with mild hepatic steatosis. Heterogeneous early arterial effusion in the posterior right hepatic lobe suggests an early vascular shunt in this region. No findings suspicious for HCC. Mild splenomegaly.  Moderate abdominal ascites. Electronically Signed   By: Charline Bills M.D.   On: 01/31/2021 09:49   US ABDOMEN LIMITED RUQ (LIVER/GB)  Result Date: 01/30/2021 CLINICAL DATA:  Right upper quadrant abdominal pain EXAM: ULTRASOUND ABDOMEN LIMITED RIGHT UPPER QUADRANT COMPARISON:  12/24/2013 FINDINGS: Gallbladder: No gallstones or wall thickening visualized. No sonographic Murphy sign noted by sonographer. Common bile duct: Diameter: 5 mm. Liver: No focal lesion identified. Coarsened, heterogeneous echotexture with nodular hepatic surface contour. Portal vein appears occluded. There are patent vascular structures seen adjacent to the portal vein within the porta hepatis which may reflect cavernous transformation. Other: Moderate volume ascites. IMPRESSION: 1. Cirrhotic hepatic morphology. No focal liver lesion is identified. 2. Portal vein appears occluded. Adjacent patent vascular structures within the porta hepatis, possibly reflecting cavernous transformation. Findings can be seen in the setting of chronic portal hypertension. Consider dedicated hepatic protocol CT of the abdomen for further evaluation, as clinically indicated. 3. Unremarkable ultrasound of the gallbladder. Electronically Signed   By: Duanne Guess D.O.   On: 01/30/2021 21:54   IR Paracentesis  Result Date: 01/31/2021 INDICATION: Patient history of alcohol use. Presented to the emergency department with  abdominal pain. Found to ascites. Request is for therapeutic and diagnostic paracentesis EXAM: ULTRASOUND GUIDED THERAPEUTIC AND DIAGNOSTIC PARACENTESIS MEDICATIONS: LIDOCAINE 1% 10 ML COMPLICATIONS: None immediate. PROCEDURE: Informed written consent was obtained from the patient after a discussion of the risks, benefits and alternatives to treatment. A timeout was performed prior to the initiation of the procedure. Initial ultrasound scanning demonstrates a small amount of ascites within the right lower abdominal quadrant. The right lower abdomen was prepped and draped in the usual sterile fashion. 1% lidocaine was used for local anesthesia. Following this, a 19 gauge, 7-cm, Yueh catheter was introduced. An ultrasound image was saved for documentation purposes. The paracentesis was performed. The catheter was removed and a dressing was applied. The patient tolerated the procedure well without immediate post procedural complication. FINDINGS: A total of approximately 2.2 L of straw-colored fluid was removed. Samples were sent to the laboratory as requested by the clinical team. IMPRESSION: Successful ultrasound-guided therapeutic and diagnostic paracentesis yielding 2.2 liters of peritoneal fluid. Read by: Anders Grant, NP Electronically Signed   By: Corlis Leak M.D.   On: 01/31/2021 12:58    ASSESMENT:   *  Decompensated cirrhosis of liver.  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  Labs pndg to r/o autoimmune dz.   Transaminases and t  bili improved.   Hep A IgM, Hep B core IgM, Hep B surface Ag, HCV Ab all non-reactive.   A1AT 215 (RR 101-187) Pndg: IgG, smooth musc Ab, AFP, Hep B surf Ab, Hep A total Ab  *  Ascites.  2.2 L tap 6/7.  Fluid studies not c/w SBP.  SAAG ~ 0.9, no c/w portal htn but calculation compromised by not having serum albumin from 6/7 when fluid albumin was drawn and non-specific # for serum albumin (reported as less than 1).  If SAAG calculated using serum albumin  of 0.7 he meets criteria for portal htn but if using 0.9, does not meet criteria.     *   Normocytic anemia.  Hgb stable at 10.9  *   Hypokalemia.    *  Thrombocytopenia.     *   Coagulopathy.  No INR today.    *   Chronic ETOH abuse.    *   Pulmonary vascular congestion, mild edema on chest x-ray.   PLAN   *    INR, CBC and Cmet tmrw.    *    Does he need echocardiogram given chest x-ray findings?  *    Continue supportive care, alcohol withdrawal precautions.  *    EGD tmrw?   Jennye Moccasin  02/01/2021, 9:36 AM Phone [phone]     Attending Physician Note   I have taken an interval history, reviewed the chart and examined the patient. I agree with the Advanced Practitioner's note, impression and recommendations.   * Decompensated cirrhosis with ascites. SAAG calculation compromised. No evidence for SBP. Suspect this alcohol related cirrhosis. Await hepatic autoimmune serologies and AFP.  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

## 2021-02-01 NOTE — Progress Notes (Signed)
PROGRESS NOTE    Donald Briggs  YPP:[id number] DOB: 1976/04/08 DOA: 01/30/2021 PCP: Claiborne Rigg, NP   Brief Narrative:  HPI: Donald Briggs is a 45 y.o. Spanish-speaking male with medical history significant of back pain, alcohol abuse, and tobacco use who presents with complaints of progressively worsening abdominal pain over the last 1 week.  History obtained with use of interpreter services.  Pain is sharp and located in the right upper quadrant.  Notes associated symptoms of left-sided chest pain,  shortness of breath, eye redness, nausea, vomiting, and intermittent blood in stools.  He last vomited yesterday and reported that emesis has been coffee-ground colored in appearance at times. Denies having any significant fever, rash, leg swelling, or calf pain. Patient admits to drinking 6 beers per day on average and his last drink was 2 days ago.  He also smokes 4 to 5 cigarettes/day on average.    ED Course: Upon admission into the emergency department patient was seen to be afebrile with blood pressure 117/82-146/94, and all other vital signs maintained.  Significant for hemoglobin 12.2, platelets 78, sodium 132, calcium 7.8, albumin 2, alkaline phosphatase 241, AST 125, ALT 30, total bilirubin 3.7, ammonia 75, and PT/INR 17.8/1.5.  Acute hepatitis panel was nonreactive.  Chest x-ray showed pulmonary vascular congestion with mild interstitial edema.  Right upper quadrant ultrasound showed cirrhotic hepatic morphology and portal vein appeared occluded.  Patient was given Librium 100 mg p.o., ibuprofen 800 mg p.o., and Zofran.  GI was consulted.  TRH called to admit.  Assessment & Plan:   Principal Problem:   Alcoholic hepatitis with ascites Active Problems:   Alcohol abuse   Hyponatremia   Transaminitis   Hematemesis with nausea   Portal vein thrombosis   Hyperammonemia (HCC)   Coagulopathy (HCC)   Pulmonary edema   Alcoholic hepatitis with ascites: Right upper quadrant ultrasound  concerning for cirrhotic hepatic morphology with no focal liver lesion identified, moderate ascites, and concern for portal vein occlusion.  CT abdomen ruled out portal relocked vision.  Acute hepatitis panel was negative.  Maddrey's discriminant  function for alcoholic hepatitis score equals 25.8 using PT control of 13.  MELD Score equals 20.  Underwent paracentesis with removal of 2.2 L of fluid on 01/31/2021.  GI was consulted and multiple lab work was ordered and pending.  Avoid NSAIDs.  Appreciate GI help.  Will start on Aldactone and Lasix.  Defer further management to them.    Suspect hematemesis with nausea  blood in stools: Acute.  Patient reported intermittently seeing coffee-ground material and blood in stools.  Hemoglobin around 10 and stable.  Continue Protonix twice daily.  Will defer to GI for timing of EGD.  Has not had further episodes.  Pulmonary edema: Acute.  Chest x-ray showed pulmonary edema and patient tells me that he came in with shortness of breath.  He feels better after paracentesis.  We will start him on Aldactone and Lasix.  No crackles on exam today.  Hyperammonemia: Acute.  Ammonia elevated but patient alert and oriented.  Alcohol abuse/dependence with alcohol withdrawal: Patient admits to drinking 6 beers per day on average.  Last drink was on Sunday according to patient.  Continue CIWA protocol with Librium.  Spent great length of time with him counseling regarding quitting alcohol.  He did not seem to be committed but he said he will try  Hyponatremia: Acute.  Sodium level 132 on admission.  Likely due to beer [place] mania.  Sodium normal today.  Hypokalemia: 3.4.  Will replace.  Thrombocytopenia: Acute on chronic.  Platelet count 78, but had been previously done in the past back in 2015.  Likely secondary to alcohol.  Monitor closely.  No signs of bleeding.  Coagulopathy: Acute.  PT 17.8 and INR 1.5.    DVT prophylaxis: SCDs Start: 01/31/21 [id number]   Code  Status: Full Code  Family Communication:  None present at bedside.  Plan of care discussed with patient in length and he verbalized understanding and agreed with it.  Status is: Inpatient  Remains inpatient appropriate because:Ongoing diagnostic testing needed not appropriate for outpatient work up   Dispo: The patient is from: Home              Anticipated d/c is to: Home              Patient currently is not medically stable to d/c.   Difficult to place patient No        Estimated body mass index is 25.66 kg/m as calculated from the following:   Height as of 11/11/18: 5\' 5"  (1.651 m).   Weight as of 11/11/18: 69.9 kg.      Nutritional status:               Consultants:   GI  Procedures:   Paracentesis  Antimicrobials:  Anti-infectives (From admission, onward)   None         Subjective: Patient seen and examined.  Spanish interpreter service was used.  Patient states that he feels much better and denies any abdominal pain or shortness of breath or any other complaint.  Denies any anxiety.  He is asking when he can be discharged.  Objective: Vitals:   01/31/21 1800 01/31/21 1912 01/31/21 2116 02/01/21 0528  BP: 125/83 121/84 114/76 97/61  Pulse: 71 74 68 (!) 57  Resp: 18 16 18 18   Temp:  98 F (36.7 C) 98.7 F (37.1 C) 98.2 F (36.8 C)  TempSrc:  Oral Oral Oral  SpO2: 100% 97% 97% 97%    Intake/Output Summary (Last 24 hours) at 02/01/2021 1000 Last data filed at 01/31/2021 2201 Gross per 24 hour  Intake 440 ml  Output --  Net 440 ml   There were no vitals filed for this visit.  Examination:  General exam: Appears calm and comfortable  Respiratory system: Clear to auscultation. Respiratory effort normal. Cardiovascular system: S1 & S2 heard, RRR. No JVD, murmurs, rubs, gallops or clicks. No pedal edema. Gastrointestinal system: Abdomen is nondistended, soft and nontender. No organomegaly or masses felt. Normal bowel sounds heard. Central  nervous system: Alert and oriented. No focal neurological deficits. Extremities: Symmetric 5 x 5 power. Skin: No rashes, lesions or ulcers Psychiatry: Judgement and insight appear normal. Mood & affect appropriate.    Data Reviewed: I have personally reviewed following labs and imaging studies  CBC: Recent Labs  Lab 01/30/21 2106 01/31/21 1111 01/31/21 1915 02/01/21 0450  WBC 7.5  --   --  6.7  HGB 12.2* 10.9* 11.1* 10.9*  10.6*  HCT 36.6* 32.4* 34.2* 32.2*  32.4*  MCV 95.6  --   --  94.4  PLT 78*  --   --  PLATELET CLUMPS NOTED ON SMEAR, UNABLE TO ESTIMATE   Basic Metabolic Panel: Recent Labs  Lab 01/30/21 2106 01/31/21 0804 02/01/21 0450  NA 132*  --  136  K 3.8  --  3.4*  CL 98  --  103  CO2 26  --  28  GLUCOSE 89  --  104*  BUN <5*  --  5*  CREATININE 0.48*  --  0.54*  CALCIUM 7.8*  --  7.9*  MG  --  1.8  --    GFR: CrCl cannot be calculated (Unknown ideal weight.). Liver Function Tests: Recent Labs  Lab 01/30/21 2106 02/01/21 0450  AST 125* 83*  ALT 30 25  ALKPHOS 241* 200*  BILITOT 3.7* 3.7*  PROT 8.3* 7.4  ALBUMIN 2.0* 1.7*   Recent Labs  Lab 01/31/21 0804  LIPASE 39   Recent Labs  Lab 01/30/21 2106  AMMONIA 75*   Coagulation Profile: Recent Labs  Lab 01/30/21 2352  INR 1.5*   Cardiac Enzymes: No results for input(s): CKTOTAL, CKMB, CKMBINDEX, TROPONINI in the last 168 hours. BNP (last 3 results) No results for input(s): PROBNP in the last 8760 hours. HbA1C: No results for input(s): HGBA1C in the last 72 hours. CBG: No results for input(s): GLUCAP in the last 168 hours. Lipid Profile: No results for input(s): CHOL, HDL, LDLCALC, TRIG, CHOLHDL, LDLDIRECT in the last 72 hours. Thyroid Function Tests: No results for input(s): TSH, T4TOTAL, FREET4, T3FREE, THYROIDAB in the last 72 hours. Anemia Panel: No results for input(s): VITAMINB12, FOLATE, FERRITIN, TIBC, IRON, RETICCTPCT in the last 72 hours. Sepsis Labs: No results for  input(s): PROCALCITON, LATICACIDVEN in the last 168 hours.  Recent Results (from the past 240 hour(s))  Resp Panel by RT-PCR (Flu A&B, Covid) Nasopharyngeal Swab     Status: None   Collection Time: 01/31/21  6:30 AM   Specimen: Nasopharyngeal Swab; Nasopharyngeal(NP) swabs in vial transport medium  Result Value Ref Range Status   SARS Coronavirus 2 by RT PCR NEGATIVE NEGATIVE Final    Comment: (NOTE) SARS-CoV-2 target nucleic acids are NOT DETECTED.  The SARS-CoV-2 RNA is generally detectable in upper respiratory specimens during the acute phase of infection. The lowest concentration of SARS-CoV-2 viral copies this assay can detect is 138 copies/mL. A negative result does not preclude SARS-Cov-2 infection and should not be used as the sole basis for treatment or other patient management decisions. A negative result may occur with  improper specimen collection/handling, submission of specimen other than nasopharyngeal swab, presence of viral mutation(s) within the areas targeted by this assay, and inadequate number of viral copies(<138 copies/mL). A negative result must be combined with clinical observations, patient history, and epidemiological information. The expected result is Negative.  Fact Sheet for Patients:  BloggerCourse.com  Fact Sheet for Healthcare Providers:  SeriousBroker.it  This test is no t yet approved or cleared by the [place] FDA and  has been authorized for detection and/or diagnosis of SARS-CoV-2 by FDA under an Emergency Use Authorization (EUA). This EUA will remain  in effect (meaning this test can be used) for the duration of the COVID-19 declaration under Section 564(b)(1) of the Act, 21 U.S.C.section 360bbb-3(b)(1), unless the authorization is terminated  or revoked sooner.       Influenza A by PCR NEGATIVE NEGATIVE Final   Influenza B by PCR NEGATIVE NEGATIVE Final    Comment: (NOTE) The  Xpert Xpress SARS-CoV-2/FLU/RSV plus assay is intended as an aid in the diagnosis of influenza from Nasopharyngeal swab specimens and should not be used as a sole basis for treatment. Nasal washings and aspirates are unacceptable for Xpert Xpress SARS-CoV-2/FLU/RSV testing.  Fact Sheet for Patients: BloggerCourse.com  Fact Sheet for Healthcare Providers: SeriousBroker.it  This test is not yet approved or cleared by the [place] and  has been authorized for detection and/or diagnosis of SARS-CoV-2 by FDA under an Emergency Use Authorization (EUA). This EUA will remain in effect (meaning this test can be used) for the duration of the COVID-19 declaration under Section 564(b)(1) of the Act, 21 U.S.C. section 360bbb-3(b)(1), unless the authorization is terminated or revoked.  Performed at [facility], [address] [place], [place] [address]   Culture, body fluid w Gram Stain-bottle     Status: None (Preliminary result)   Collection Time: 01/31/21 10:26 AM   Specimen: Fluid  Result Value Ref Range Status   Specimen Description FLUID  Final   Special Requests NONE  Final   Culture   Final    NO GROWTH < 24 HOURS Performed at [facility], [address], [place], [place] [address]    Report Status PENDING  Incomplete  Gram stain     Status: None   Collection Time: 01/31/21 10:26 AM   Specimen: Fluid  Result Value Ref Range Status   Specimen Description FLUID  Final   Special Requests NONE  Final   Gram Stain   Final    WBC PRESENT,BOTH PMN AND MONONUCLEAR NO ORGANISMS SEEN CYTOSPIN SMEAR Performed at [facility], [address], [place], [place] [address]    Report Status 01/31/2021 FINAL  Final      Radiology Studies: DG Chest 2 View  Result Date: 01/30/2021 CLINICAL DATA:  Right upper quadrant pain and diarrhea. EXAM: CHEST - 2 VIEW COMPARISON:  December 24, 2013 FINDINGS: Mild, diffusely  increased interstitial lung markings are seen. There is no evidence of pleural effusion or pneumothorax. Prominence of the bilateral perihilar pulmonary vasculature is seen. The heart size and mediastinal contours are within normal limits. Chronic sclerotic changes are seen along the eighth right rib. IMPRESSION: Pulmonary vascular congestion with mild interstitial edema. Electronically Signed   By: Aram Candela M.D.   On: 01/30/2021 21:18   CT LIVER ABDOMEN W WO CONTRAST  Result Date: 01/31/2021 CLINICAL DATA:  Cirrhosis, evaluate for portal vein thrombosis EXAM: CT ABDOMEN WITHOUT AND WITH CONTRAST TECHNIQUE: Multidetector CT imaging of the abdomen was performed following the standard protocol before and following the bolus administration of intravenous contrast. CONTRAST:  OMNIPAQUE IOHEXOL 300 MG/ML  SOLN COMPARISON:  Right upper quadrant ultrasound dated 01/30/2021 FINDINGS: Lower chest: Lung bases are clear. Hepatobiliary: Cirrhosis. Mild hepatic steatosis. Heterogeneous early arterial perfusion in the posterior right hepatic lobe, suggesting an early vascular shunt to this region, possibly iatrogenic. No suspicious/enhancing lesions. Gallbladder is unremarkable. No intrahepatic or extrahepatic ductal dilatation. Pancreas: Within normal limits. Spleen: Mildly enlarged, measuring 16.5 cm in maximal craniocaudal dimension. Adrenals/Urinary Tract: Adrenal glands are within normal limits. Kidneys are within normal limits.  No hydronephrosis. Stomach/Bowel: Stomach is within normal limits. Visualized bowel is grossly unremarkable. Vascular/Lymphatic: No evidence of abdominal aortic aneurysm. Patent portal vein, noting mixing artifact in the early arterial phase. Other: Moderate abdominal ascites. Musculoskeletal: Degenerative changes of the visualized thoracolumbar spine. IMPRESSION: Patent portal vein. Cirrhosis with mild hepatic steatosis. Heterogeneous early arterial effusion in the posterior right  hepatic lobe suggests an early vascular shunt in this region. No findings suspicious for HCC. Mild splenomegaly.  Moderate abdominal ascites. Electronically Signed   By: Charline Bills M.D.   On: 01/31/2021 09:49   US ABDOMEN LIMITED RUQ (LIVER/GB)  Result Date: 01/30/2021 CLINICAL DATA:  Right upper quadrant abdominal pain EXAM: ULTRASOUND ABDOMEN LIMITED RIGHT UPPER QUADRANT COMPARISON:  12/24/2013 FINDINGS:  Gallbladder: No gallstones or wall thickening visualized. No sonographic Murphy sign noted by sonographer. Common bile duct: Diameter: 5 mm. Liver: No focal lesion identified. Coarsened, heterogeneous echotexture with nodular hepatic surface contour. Portal vein appears occluded. There are patent vascular structures seen adjacent to the portal vein within the porta hepatis which may reflect cavernous transformation. Other: Moderate volume ascites. IMPRESSION: 1. Cirrhotic hepatic morphology. No focal liver lesion is identified. 2. Portal vein appears occluded. Adjacent patent vascular structures within the porta hepatis, possibly reflecting cavernous transformation. Findings can be seen in the setting of chronic portal hypertension. Consider dedicated hepatic protocol CT of the abdomen for further evaluation, as clinically indicated. 3. Unremarkable ultrasound of the gallbladder. Electronically Signed   By: Duanne Guess D.O.   On: 01/30/2021 21:54   IR Paracentesis  Result Date: 01/31/2021 INDICATION: Patient history of alcohol use. Presented to the emergency department with abdominal pain. Found to ascites. Request is for therapeutic and diagnostic paracentesis EXAM: ULTRASOUND GUIDED THERAPEUTIC AND DIAGNOSTIC PARACENTESIS MEDICATIONS: LIDOCAINE 1% 10 ML COMPLICATIONS: None immediate. PROCEDURE: Informed written consent was obtained from the patient after a discussion of the risks, benefits and alternatives to treatment. A timeout was performed prior to the initiation of the procedure. Initial  ultrasound scanning demonstrates a small amount of ascites within the right lower abdominal quadrant. The right lower abdomen was prepped and draped in the usual sterile fashion. 1% lidocaine was used for local anesthesia. Following this, a 19 gauge, 7-cm, Yueh catheter was introduced. An ultrasound image was saved for documentation purposes. The paracentesis was performed. The catheter was removed and a dressing was applied. The patient tolerated the procedure well without immediate post procedural complication. FINDINGS: A total of approximately 2.2 L of straw-colored fluid was removed. Samples were sent to the laboratory as requested by the clinical team. IMPRESSION: Successful ultrasound-guided therapeutic and diagnostic paracentesis yielding 2.2 liters of peritoneal fluid. Read by: Anders Grant, NP Electronically Signed   By: Corlis Leak M.D.   On: 01/31/2021 12:58    Scheduled Meds: . chlordiazePOXIDE  25 mg Oral TID   Followed by  . [START ON 02/02/2021] chlordiazePOXIDE  25 mg Oral BH-qamhs   Followed by  . [START ON 02/03/2021] chlordiazePOXIDE  25 mg Oral Daily  . multivitamin with minerals  1 tablet Oral Daily  . pantoprazole (PROTONIX) IV  40 mg Intravenous Q12H  . potassium chloride  40 mEq Oral Once  . sodium chloride flush  3 mL Intravenous Q12H  . thiamine  100 mg Oral Daily   Continuous Infusions:   LOS: 1 day   Time spent: 35 minutes   Hughie Closs, MD [facility]  02/01/2021, 10:00 AM   How to contact the [facility] Attending or Consulting provider 7A - 7P or covering provider during after hours 7P -7A, for this patient?  1. Check the care team in [facility] and look for a) attending/consulting [facility] provider listed and b) the [facility] team listed. Page or secure chat 7A-7P. 2. Log into [email or web address] and use [facility]'s universal password to access. If you do not have the password, please contact the hospital operator. 3. Locate the [facility] provider you are looking for under [facility] and page to a number that you can be directly reached. 4. If you still have difficulty reaching the provider, please page the [facility] (Director on Call) for the Hospitalists listed on amion for assistance.

## 2021-02-01 NOTE — Progress Notes (Addendum)
Daily Rounding Note  02/01/2021, 9:36 AM  LOS: 1 day   SUBJECTIVE:   Chief complaint: decompensated ETOH liver dz.  New Dx cirrhosis.        BPs remain soft.  No tachycardia.  Excellent sats on RA.   Reports brown stool yesterday, no bowel movements yet today.  No abdominal pain.  No nausea or vomiting on 2 g sodium diet.  OBJECTIVE:         Vital signs in last 24 hours:    Temp:  [98 F (36.7 C)-98.7 F (37.1 C)] 98.2 F (36.8 C) (06/08 0528) Pulse Rate:  [57-84] 57 (06/08 0528) Resp:  [16-23] 18 (06/08 0528) BP: (97-132)/(61-89) 97/61 (06/08 0528) SpO2:  [96 %-100 %] 97 % (06/08 0528)   There were no vitals filed for this visit. General: resting comfortably in bed.  Looks better than he did yesterday but still looks PO and jaundiced. Heart: RRR. Chest: No labored breathing or cough.  A few crackles in the left base. Abdomen: Soft without distention.  Liver edge palpable ~ 12 cm below costal margin, nontender.  Normal, active bowel sounds. Extremities: No CCE. Neuro/Psych: No asterixis, no tremors.  Moves all 4 limbs.  Oriented x3.  Intake/Output from previous day: 06/07 0701 - 06/08 0700 In: 440 [P.O.:440] Out: -   Intake/Output this shift: No intake/output data recorded.  Lab Results: Recent Labs    01/30/21 2106 01/31/21 1111 01/31/21 1915 02/01/21 0450  WBC 7.5  --   --  6.7  HGB 12.2* 10.9* 11.1* 10.9*  10.6*  HCT 36.6* 32.4* 34.2* 32.2*  32.4*  PLT 78*  --   --  PLATELET CLUMPS NOTED ON SMEAR, UNABLE TO ESTIMATE   BMET Recent Labs    01/30/21 2106 02/01/21 0450  NA 132* 136  K 3.8 3.4*  CL 98 103  CO2 26 28  GLUCOSE 89 104*  BUN <5* 5*  CREATININE 0.48* 0.54*  CALCIUM 7.8* 7.9*   LFT Recent Labs    01/30/21 2106 01/31/21 0804 02/01/21 0450  PROT 8.3*  --  7.4  ALBUMIN 2.0*  --  1.7*  AST 125*  --  83*  ALT 30  --  25  ALKPHOS 241*  --  200*  BILITOT 3.7*  --  3.7*  BILIDIR   --  1.6*  --    PT/INR Recent Labs    01/30/21 2352  LABPROT 17.8*  INR 1.5*   Hepatitis Panel Recent Labs    01/30/21 2106  HEPBSAG NON REACTIVE  HCVAB NON REACTIVE  HEPAIGM NON REACTIVE  HEPBIGM NON REACTIVE    Studies/Results: DG Chest 2 View  Result Date: 01/30/2021 CLINICAL DATA:  Right upper quadrant pain and diarrhea. EXAM: CHEST - 2 VIEW COMPARISON:  December 24, 2013 FINDINGS: Mild, diffusely increased interstitial lung markings are seen. There is no evidence of pleural effusion or pneumothorax. Prominence of the bilateral perihilar pulmonary vasculature is seen. The heart size and mediastinal contours are within normal limits. Chronic sclerotic changes are seen along the eighth right rib. IMPRESSION: Pulmonary vascular congestion with mild interstitial edema. Electronically Signed   By: Aram Candela M.D.   On: 01/30/2021 21:18   CT LIVER ABDOMEN W WO CONTRAST  Result Date: 01/31/2021 CLINICAL DATA:  Cirrhosis, evaluate for portal vein thrombosis EXAM: CT ABDOMEN WITHOUT AND WITH CONTRAST TECHNIQUE: Multidetector CT imaging of the abdomen was performed following the standard protocol before and following the bolus  administration of intravenous contrast. CONTRAST:  OMNIPAQUE IOHEXOL 300 MG/ML  SOLN COMPARISON:  Right upper quadrant ultrasound dated 01/30/2021 FINDINGS: Lower chest: Lung bases are clear. Hepatobiliary: Cirrhosis. Mild hepatic steatosis. Heterogeneous early arterial perfusion in the posterior right hepatic lobe, suggesting an early vascular shunt to this region, possibly iatrogenic. No suspicious/enhancing lesions. Gallbladder is unremarkable. No intrahepatic or extrahepatic ductal dilatation. Pancreas: Within normal limits. Spleen: Mildly enlarged, measuring 16.5 cm in maximal craniocaudal dimension. Adrenals/Urinary Tract: Adrenal glands are within normal limits. Kidneys are within normal limits.  No hydronephrosis. Stomach/Bowel: Stomach is within normal  limits. Visualized bowel is grossly unremarkable. Vascular/Lymphatic: No evidence of abdominal aortic aneurysm. Patent portal vein, noting mixing artifact in the early arterial phase. Other: Moderate abdominal ascites. Musculoskeletal: Degenerative changes of the visualized thoracolumbar spine. IMPRESSION: Patent portal vein. Cirrhosis with mild hepatic steatosis. Heterogeneous early arterial effusion in the posterior right hepatic lobe suggests an early vascular shunt in this region. No findings suspicious for HCC. Mild splenomegaly.  Moderate abdominal ascites. Electronically Signed   By: Charline Bills M.D.   On: 01/31/2021 09:49   US ABDOMEN LIMITED RUQ (LIVER/GB)  Result Date: 01/30/2021 CLINICAL DATA:  Right upper quadrant abdominal pain EXAM: ULTRASOUND ABDOMEN LIMITED RIGHT UPPER QUADRANT COMPARISON:  12/24/2013 FINDINGS: Gallbladder: No gallstones or wall thickening visualized. No sonographic Murphy sign noted by sonographer. Common bile duct: Diameter: 5 mm. Liver: No focal lesion identified. Coarsened, heterogeneous echotexture with nodular hepatic surface contour. Portal vein appears occluded. There are patent vascular structures seen adjacent to the portal vein within the porta hepatis which may reflect cavernous transformation. Other: Moderate volume ascites. IMPRESSION: 1. Cirrhotic hepatic morphology. No focal liver lesion is identified. 2. Portal vein appears occluded. Adjacent patent vascular structures within the porta hepatis, possibly reflecting cavernous transformation. Findings can be seen in the setting of chronic portal hypertension. Consider dedicated hepatic protocol CT of the abdomen for further evaluation, as clinically indicated. 3. Unremarkable ultrasound of the gallbladder. Electronically Signed   By: Duanne Guess D.O.   On: 01/30/2021 21:54   IR Paracentesis  Result Date: 01/31/2021 INDICATION: Patient history of alcohol use. Presented to the emergency department with  abdominal pain. Found to ascites. Request is for therapeutic and diagnostic paracentesis EXAM: ULTRASOUND GUIDED THERAPEUTIC AND DIAGNOSTIC PARACENTESIS MEDICATIONS: LIDOCAINE 1% 10 ML COMPLICATIONS: None immediate. PROCEDURE: Informed written consent was obtained from the patient after a discussion of the risks, benefits and alternatives to treatment. A timeout was performed prior to the initiation of the procedure. Initial ultrasound scanning demonstrates a small amount of ascites within the right lower abdominal quadrant. The right lower abdomen was prepped and draped in the usual sterile fashion. 1% lidocaine was used for local anesthesia. Following this, a 19 gauge, 7-cm, Yueh catheter was introduced. An ultrasound image was saved for documentation purposes. The paracentesis was performed. The catheter was removed and a dressing was applied. The patient tolerated the procedure well without immediate post procedural complication. FINDINGS: A total of approximately 2.2 L of straw-colored fluid was removed. Samples were sent to the laboratory as requested by the clinical team. IMPRESSION: Successful ultrasound-guided therapeutic and diagnostic paracentesis yielding 2.2 liters of peritoneal fluid. Read by: Anders Grant, NP Electronically Signed   By: Corlis Leak M.D.   On: 01/31/2021 12:58    ASSESMENT:   *  Decompensated cirrhosis of liver.  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  Labs pndg to r/o autoimmune dz.   Transaminases and t  bili improved.   Hep A IgM, Hep B core IgM, Hep B surface Ag, HCV Ab all non-reactive.   A1AT 215 (RR 101-187) Pndg: IgG, smooth musc Ab, AFP, Hep B surf Ab, Hep A total Ab  *  Ascites.  2.2 L tap 6/7.  Fluid studies not c/w SBP.  SAAG ~ 0.9, no c/w portal htn but calculation compromised by not having serum albumin from 6/7 when fluid albumin was drawn and non-specific # for serum albumin (reported as less than 1).  If SAAG calculated using serum albumin  of 0.7 he meets criteria for portal htn but if using 0.9, does not meet criteria.     *   Normocytic anemia.  Hgb stable at 10.9  *   Hypokalemia.    *  Thrombocytopenia.     *   Coagulopathy.  No INR today.    *   Chronic ETOH abuse.    *   Pulmonary vascular congestion, mild edema on chest x-ray.   PLAN   *    INR, CBC and Cmet tmrw.    *    Does he need echocardiogram given chest x-ray findings?  *    Continue supportive care, alcohol withdrawal precautions.  *    EGD tmrw?   Donald Briggs  02/01/2021, 9:36 AM Phone [phone]     Attending Physician Note   I have taken an interval history, reviewed the chart and examined the patient. I agree with the Advanced Practitioner's note, impression and recommendations.   * Decompensated cirrhosis with ascites. SAAG calculation compromised. No evidence for SBP. Suspect this alcohol related cirrhosis. Await hepatic autoimmune serologies and AFP.  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

## 2021-02-02 ENCOUNTER — Inpatient Hospital Stay (HOSPITAL_COMMUNITY): Payer: Self-pay

## 2021-02-02 ENCOUNTER — Encounter (HOSPITAL_COMMUNITY)
Admission: EM | Disposition: A | Discharge status: Home / Self Care | Payer: Self-pay | Source: Home / Self Care | Attending: Family Medicine

## 2021-02-02 ENCOUNTER — Inpatient Hospital Stay (HOSPITAL_COMMUNITY): Payer: Self-pay | Admitting: Anesthesiology

## 2021-02-02 ENCOUNTER — Encounter (HOSPITAL_COMMUNITY): Payer: Self-pay | Admitting: Internal Medicine

## 2021-02-02 DIAGNOSIS — R112 Nausea with vomiting, unspecified: Secondary | ICD-10-CM

## 2021-02-02 DIAGNOSIS — I5031 Acute diastolic (congestive) heart failure: Secondary | ICD-10-CM

## 2021-02-02 HISTORY — PX: ESOPHAGOGASTRODUODENOSCOPY (EGD) WITH PROPOFOL: SHX5813

## 2021-02-02 LAB — CBC WITH DIFFERENTIAL/PLATELET
Abs Immature Granulocytes: 0.02 10*3/uL (ref 0.00–0.07)
Basophils Absolute: 0.1 10*3/uL (ref 0.0–0.1)
Basophils Relative: 1 %
Eosinophils Absolute: 0.4 10*3/uL (ref 0.0–0.5)
Eosinophils Relative: 5 %
HCT: 34.5 % — ABNORMAL LOW (ref 39.0–52.0)
Hemoglobin: 11.3 g/dL — ABNORMAL LOW (ref 13.0–17.0)
Immature Granulocytes: 0 %
Lymphocytes Relative: 14 %
Lymphs Abs: 0.9 10*3/uL (ref 0.7–4.0)
MCH: 31.1 pg (ref 26.0–34.0)
MCHC: 32.8 g/dL (ref 30.0–36.0)
MCV: 95 fL (ref 80.0–100.0)
Monocytes Absolute: 0.5 10*3/uL (ref 0.1–1.0)
Monocytes Relative: 7 %
Neutro Abs: 4.8 10*3/uL (ref 1.7–7.7)
Neutrophils Relative %: 73 %
Platelets: UNDETERMINED 10*3/uL (ref 150–400)
RBC: 3.63 MIL/uL — ABNORMAL LOW (ref 4.22–5.81)
RDW: 15.9 % — ABNORMAL HIGH (ref 11.5–15.5)
WBC: 6.6 10*3/uL (ref 4.0–10.5)
nRBC: 0 % (ref 0.0–0.2)

## 2021-02-02 LAB — COMPREHENSIVE METABOLIC PANEL
ALT: 24 U/L (ref 0–44)
AST: 84 U/L — ABNORMAL HIGH (ref 15–41)
Albumin: 1.8 g/dL — ABNORMAL LOW (ref 3.5–5.0)
Alkaline Phosphatase: 200 U/L — ABNORMAL HIGH (ref 38–126)
Anion gap: 6 (ref 5–15)
BUN: 6 mg/dL (ref 6–20)
CO2: 26 mmol/L (ref 22–32)
Calcium: 8.1 mg/dL — ABNORMAL LOW (ref 8.9–10.3)
Chloride: 101 mmol/L (ref 98–111)
Creatinine, Ser: 0.63 mg/dL (ref 0.61–1.24)
GFR, Estimated: >60 mL/min (ref >60)
Glucose, Bld: 107 mg/dL — ABNORMAL HIGH (ref 70–99)
Potassium: 3.9 mmol/L (ref 3.5–5.1)
Sodium: 133 mmol/L — ABNORMAL LOW (ref 135–145)
Total Bilirubin: 2.8 mg/dL — ABNORMAL HIGH (ref 0.3–1.2)
Total Protein: 7.5 g/dL (ref 6.5–8.1)

## 2021-02-02 LAB — AFP TUMOR MARKER: AFP, Serum, Tumor Marker: 2.8 ng/mL (ref 0.0–6.9)

## 2021-02-02 LAB — ECHOCARDIOGRAM COMPLETE
AR max vel: 2.36 cm2
AV Area VTI: 3.02 cm2
AV Area mean vel: 2.51 cm2
AV Mean grad: 4 mmHg
AV Peak grad: 7.3 mmHg
Ao pk vel: 1.35 m/s
Area-P 1/2: 3.31 cm2
MV VTI: 2.35 cm2
S' Lateral: 2.7 cm

## 2021-02-02 LAB — PROTIME-INR
INR: 1.7 — ABNORMAL HIGH (ref 0.8–1.2)
Prothrombin Time: 20.3 seconds — ABNORMAL HIGH (ref 11.4–15.2)

## 2021-02-02 LAB — MITOCHONDRIAL ANTIBODIES: Mitochondrial M2 Ab, IgG: <20 Units (ref 0.0–20.0)

## 2021-02-02 LAB — IGG: IgG (Immunoglobin G), Serum: 2671 mg/dL — ABNORMAL HIGH (ref 603–1613)

## 2021-02-02 LAB — ANTI-SMOOTH MUSCLE ANTIBODY, IGG: F-Actin IgG: 20 Units — ABNORMAL HIGH (ref 0–19)

## 2021-02-02 SURGERY — ESOPHAGOGASTRODUODENOSCOPY (EGD) WITH PROPOFOL
Anesthesia: General

## 2021-02-02 MED ORDER — PANTOPRAZOLE SODIUM 40 MG PO TBEC
40.0000 mg | DELAYED_RELEASE_TABLET | Freq: Every day | ORAL | Status: DC
  Administered 2021-02-02 – 2021-02-04 (×3): 40 mg via ORAL
  Filled 2021-02-02 (×3): qty 1

## 2021-02-02 MED ORDER — LACTATED RINGERS IV SOLN
INTRAVENOUS | Status: DC
  Administered 2021-02-02: 1000 mL via INTRAVENOUS

## 2021-02-02 MED ORDER — PROPOFOL 10 MG/ML IV BOLUS
INTRAVENOUS | Status: DC | PRN
  Administered 2021-02-02: 20 mg via INTRAVENOUS
  Administered 2021-02-02: 30 mg via INTRAVENOUS

## 2021-02-02 MED ORDER — LIDOCAINE 2% (20 MG/ML) 5 ML SYRINGE
INTRAMUSCULAR | Status: DC | PRN
  Administered 2021-02-02: 40 mg via INTRAVENOUS

## 2021-02-02 MED ORDER — PROPOFOL 500 MG/50ML IV EMUL
INTRAVENOUS | Status: DC | PRN
  Administered 2021-02-02: 125 ug/kg/min via INTRAVENOUS

## 2021-02-02 SURGICAL SUPPLY — 15 items

## 2021-02-02 NOTE — Op Note (Signed)
[facility] Patient Name: Donald Briggs Procedure Date : 02/02/2021 MRN: [id number] Attending MD: Meryl Dare , MD Date of Birth: 04-28-1976 CSN: [id number] Age: 45 Admit Type: Inpatient Procedure:                Upper GI endoscopy Indications:              Coffee-ground emesis, Nausea with vomiting Providers:                Venita Lick. Russella Dar, MD, Nehemiah Settle Person, Leanne Lovely, Technician, Roma Kayser, CRNA Referring MD:             [facility] Medicines:                Monitored Anesthesia Care Complications:            No immediate complications. Estimated Blood Loss:     Estimated blood loss was minimal. Procedure:                Pre-Anesthesia Assessment:                           - Prior to the procedure, a History and Physical                            was performed, and patient medications and                            allergies were reviewed. The patient's tolerance of                            previous anesthesia was also reviewed. The risks                            and benefits of the procedure and the sedation                            options and risks were discussed with the patient.                            All questions were answered, and informed consent                            was obtained. Prior Anticoagulants: The patient has                            taken no previous anticoagulant or antiplatelet                            agents. ASA Grade Assessment: III - A patient with                            severe systemic disease. After reviewing the risks  and benefits, the patient was deemed in                            satisfactory condition to undergo the procedure.                           After obtaining informed consent, the endoscope was                            passed under direct vision. Throughout the                            procedure, the patient's blood pressure, pulse,  and                            oxygen saturations were monitored continuously. The                            [id number]-[id number] ([id number] Olympus gastroscope was                            introduced through the mouth, and advanced to the                            second part of duodenum. The upper GI endoscopy was                            accomplished without difficulty. The patient                            tolerated the procedure well. Scope In: Scope Out: Findings:      Grade I varices were found in the distal esophagus. They were 4 mm in       largest diameter. No stigmata of recent bleed.      The exam of the esophagus was otherwise normal.      Severe portal hypertensive gastropathy was found in the entire examined       stomach. Mild spontaneous oozing and mild contact oozing noted.      The exam of the stomach was otherwise normal.      The duodenal bulb and second portion of the duodenum were normal. Impression:               - Grade I esophageal varices.                           - Portal hypertensive gastropathy.                           - Normal duodenal bulb and second portion of the                            duodenum.                           - No specimens collected. Recommendation:           -  Return patient to hospital ward for ongoing care.                           - Full liquid diet today and advance as tolerated.                           - Continue present medications.                           - Continue Aldactone and Lasix titrated for ascites                            mgmt, monitoring renal function and electrolytes.                           - Change pantoprazole to 40 mg po qd.                           - No aspirin, ibuprofen, naproxen, or other                            non-steroidal anti-inflammatory drugs long term.                           - No more than 2g Tylenol daily long term.                           - Strictly avoid all alcohol use.                            - GI signing off. Available as needed. Procedure Code(s):        --- Professional ---                           [phone], Esophagogastroduodenoscopy, flexible,                            transoral; diagnostic, including collection of                            specimen(s) by brushing or washing, when performed                            (separate procedure) Diagnosis Code(s):        --- Professional ---                           I85.00, Esophageal varices without bleeding                           K76.6, Portal hypertension                           K31.89, Other diseases of stomach and duodenum  K92.0, Hematemesis                           R11.2, Nausea with vomiting, unspecified CPT copyright 2019 [employer]. All rights reserved. The codes documented in this report are preliminary and upon coder review may  be revised to meet current compliance requirements. Meryl Dare, MD 02/02/2021 11:08:12 AM This report has been signed electronically. Number of Addenda: 0

## 2021-02-02 NOTE — Progress Notes (Signed)
  Echocardiogram 2D Echocardiogram has been performed.  Gerda Diss 02/02/2021, 3:51 PM

## 2021-02-02 NOTE — Progress Notes (Signed)
PROGRESS NOTE    Donald Briggs  UXN:[id number] DOB: 09-07-1975 DOA: 01/30/2021 PCP: Claiborne Rigg, NP   Brief Narrative:  HPI: Donald Briggs is a 45 y.o. Spanish-speaking male with medical history significant of back pain, alcohol abuse, and tobacco use who presents with complaints of progressively worsening abdominal pain over the last 1 week.  History obtained with use of interpreter services.  Pain is sharp and located in the right upper quadrant.  Notes associated symptoms of left-sided chest pain,  shortness of breath, eye redness, nausea, vomiting, and intermittent blood in stools.  He last vomited yesterday and reported that emesis has been coffee-ground colored in appearance at times. Denies having any significant fever, rash, leg swelling, or calf pain. Patient admits to drinking 6 beers per day on average and his last drink was 2 days ago.  He also smokes 4 to 5 cigarettes/day on average.     ED Course: Upon admission into the emergency department patient was seen to be afebrile with blood pressure 117/82-146/94, and all other vital signs maintained.  Significant for hemoglobin 12.2, platelets 78, sodium 132, calcium 7.8, albumin 2, alkaline phosphatase 241, AST 125, ALT 30, total bilirubin 3.7, ammonia 75, and PT/INR 17.8/1.5.  Acute hepatitis panel was nonreactive.  Chest x-ray showed pulmonary vascular congestion with mild interstitial edema.  Right upper quadrant ultrasound showed cirrhotic hepatic morphology and portal vein appeared occluded.  Patient was given Librium 100 mg p.o., ibuprofen 800 mg p.o., and Zofran.  GI was consulted.  TRH called to admit.  Assessment & Plan:   Principal Problem:   Alcoholic hepatitis with ascites Active Problems:   Alcohol abuse   Hyponatremia   Transaminitis   Coffee ground emesis   Portal vein thrombosis   Hyperammonemia (HCC)   Coagulopathy (HCC)   Pulmonary edema   Alcoholic cirrhosis of liver with ascites (HCC)   Alcoholic  hepatitis with ascites: Right upper quadrant ultrasound concerning for cirrhotic hepatic morphology with no focal liver lesion identified, moderate ascites, and concern for portal vein occlusion.  CT abdomen ruled out portal vein occlusion.  Viral hepatitis panel shows positive antibodies against hepatitis A but negative for B and C.  Has elevated IgG and alpha-1 antitrypsin.  I have sent a message to Dr. Russella Dar to provide his recommendations regarding those.  Shows Maddrey's discriminant  function for alcoholic hepatitis score equals 25.8 using PT control of 13.  MELD Score equals 20.  Underwent paracentesis with removal of 2.2 L of fluid on 01/31/2021.  GI was consulted and multiple lab work was ordered and pending.  Avoid NSAIDs.  Continue Aldactone and Lasix.   Suspect hematemesis with nausea  blood in stools: Acute.  Patient reported intermittently seeing coffee-ground material and blood in stools.  Hemoglobin stable.  S/p EGD today which shows grade 1 esophageal varices, portal hypertensive gastropathy but normal duodenum and no active source of bleeding.  GI downgraded Protonix to once daily.  Started on full liquid diet and we will advance later today.  Observe overnight.   Pulmonary edema: Acute.  Chest x-ray showed pulmonary edema and patient tells me that he came in with shortness of breath.  He feels better after paracentesis.  No crackles.  Echo pending.   Hyperammonemia: Acute.  Ammonia elevated but patient alert and oriented.   Alcohol abuse/dependence with alcohol withdrawal: Patient admits to drinking 6 beers per day on average.  Last drink was on Sunday according to patient.  Continue CIWA protocol with Librium.  Spent great length of time with him counseling regarding quitting alcohol.  He did not seem to be committed but he said he will try   Hyponatremia: Acute.  Sodium level 132 on admission.  Likely due to beer Protomania.  Sodium stable.  Hypokalemia: Resolved.  Thrombocytopenia:  Acute on chronic.  Platelet count 78, at the time of admission but CBC has showed clumped platelets since last 2 days.   Coagulopathy: Acute.  Secondary to liver cirrhosis.  Stable.  Monitor.  DVT prophylaxis: SCDs Start: 01/31/21 [id number]   Code Status: Full Code  Family Communication:  None present at bedside.  Plan of care discussed with patient in length and he verbalized understanding and agreed with it.  Status is: Inpatient  Remains inpatient appropriate because:Ongoing diagnostic testing needed not appropriate for outpatient work up   Dispo: The patient is from: Home              Anticipated d/c is to: Home              Patient currently is not medically stable to d/c.   Difficult to place patient No        Estimated body mass index is 25.66 kg/m as calculated from the following:   Height as of 11/11/18: 5\' 5"  (1.651 m).   Weight as of 11/11/18: 69.9 kg.      Nutritional status:               Consultants:  GI  Procedures:  Paracentesis  Antimicrobials:  Anti-infectives (From admission, onward)    None          Subjective: Seen and examined earlier before EGD today.  Complained of being hungry.  No other specific complaint.  Objective: Vitals:   02/02/21 1102 02/02/21 1105 02/02/21 1108 02/02/21 1118  BP: (!) 109/47 121/71 (!) 148/61 (!) 146/95  Pulse: (!) 58 (!) 58 69 71  Resp: 16 (!) 24 19 20   Temp:      TempSrc:      SpO2: 100% 100% 100% 100%    Intake/Output Summary (Last 24 hours) at 02/02/2021 1459 Last data filed at 02/02/2021 1046 Gross per 24 hour  Intake 783 ml  Output 0 ml  Net 783 ml    There were no vitals filed for this visit.  Examination:  General exam: Appears calm and comfortable  Respiratory system: Clear to auscultation. Respiratory effort normal. Cardiovascular system: S1 & S2 heard, RRR. No JVD, murmurs, rubs, gallops or clicks. No pedal edema. Gastrointestinal system: Abdomen is nondistended, soft and  nontender. No organomegaly or masses felt. Normal bowel sounds heard. Central nervous system: Alert and oriented. No focal neurological deficits. Extremities: Symmetric 5 x 5 power. Skin: No rashes, lesions or ulcers.  Psychiatry: Judgement and insight appear normal. Mood & affect appropriate.    Data Reviewed: I have personally reviewed following labs and imaging studies  CBC: Recent Labs  Lab 01/30/21 2106 01/31/21 1111 01/31/21 1915 02/01/21 0450 02/02/21 0243  WBC 7.5  --   --  6.7 6.6  NEUTROABS  --   --   --   --  4.8  HGB 12.2* 10.9* 11.1* 10.9*  10.6* 11.3*  HCT 36.6* 32.4* 34.2* 32.2*  32.4* 34.5*  MCV 95.6  --   --  94.4 95.0  PLT 78*  --   --  PLATELET CLUMPS NOTED ON SMEAR, UNABLE TO ESTIMATE PLATELET CLUMPS NOTED ON SMEAR, UNABLE TO ESTIMATE    Basic Metabolic Panel: Recent  Labs  Lab 01/30/21 2106 01/31/21 0804 02/01/21 0450 02/01/21 1019 02/02/21 0243  NA 132*  --  136  --  133*  K 3.8  --  3.4*  --  3.9  CL 98  --  103  --  101  CO2 26  --  28  --  26  GLUCOSE 89  --  104*  --  107*  BUN <5*  --  5*  --  6  CREATININE 0.48*  --  0.54*  --  0.63  CALCIUM 7.8*  --  7.9*  --  8.1*  MG  --  1.8  --  1.9  --     GFR: CrCl cannot be calculated (Unknown ideal weight.). Liver Function Tests: Recent Labs  Lab 01/30/21 2106 02/01/21 0450 02/02/21 0243  AST 125* 83* 84*  ALT 30 25 24   ALKPHOS 241* 200* 200*  BILITOT 3.7* 3.7* 2.8*  PROT 8.3* 7.4 7.5  ALBUMIN 2.0* 1.7* 1.8*    Recent Labs  Lab 01/31/21 0804  LIPASE 39    Recent Labs  Lab 01/30/21 2106  AMMONIA 75*    Coagulation Profile: Recent Labs  Lab 01/30/21 2352 02/02/21 0243  INR 1.5* 1.7*    Cardiac Enzymes: No results for input(s): CKTOTAL, CKMB, CKMBINDEX, TROPONINI in the last 168 hours. BNP (last 3 results) No results for input(s): PROBNP in the last 8760 hours. HbA1C: No results for input(s): HGBA1C in the last 72 hours. CBG: No results for input(s): GLUCAP in  the last 168 hours. Lipid Profile: No results for input(s): CHOL, HDL, LDLCALC, TRIG, CHOLHDL, LDLDIRECT in the last 72 hours. Thyroid Function Tests: No results for input(s): TSH, T4TOTAL, FREET4, T3FREE, THYROIDAB in the last 72 hours. Anemia Panel: No results for input(s): VITAMINB12, FOLATE, FERRITIN, TIBC, IRON, RETICCTPCT in the last 72 hours. Sepsis Labs: No results for input(s): PROCALCITON, LATICACIDVEN in the last 168 hours.  Recent Results (from the past 240 hour(s))  Resp Panel by RT-PCR (Flu A&B, Covid) Nasopharyngeal Swab     Status: None   Collection Time: 01/31/21  6:30 AM   Specimen: Nasopharyngeal Swab; Nasopharyngeal(NP) swabs in vial transport medium  Result Value Ref Range Status   SARS Coronavirus 2 by RT PCR NEGATIVE NEGATIVE Final    Comment: (NOTE) SARS-CoV-2 target nucleic acids are NOT DETECTED.  The SARS-CoV-2 RNA is generally detectable in upper respiratory specimens during the acute phase of infection. The lowest concentration of SARS-CoV-2 viral copies this assay can detect is 138 copies/mL. A negative result does not preclude SARS-Cov-2 infection and should not be used as the sole basis for treatment or other patient management decisions. A negative result may occur with  improper specimen collection/handling, submission of specimen other than nasopharyngeal swab, presence of viral mutation(s) within the areas targeted by this assay, and inadequate number of viral copies(<138 copies/mL). A negative result must be combined with clinical observations, patient history, and epidemiological information. The expected result is Negative.  Fact Sheet for Patients:  04/02/21  Fact Sheet for Healthcare Providers:  BloggerCourse.com  This test is no t yet approved or cleared by the [place] FDA and  has been authorized for detection and/or diagnosis of SARS-CoV-2 by FDA under an Emergency Use  Authorization (EUA). This EUA will remain  in effect (meaning this test can be used) for the duration of the COVID-19 declaration under Section 564(b)(1) of the Act, 21 U.S.C.section 360bbb-3(b)(1), unless the authorization is terminated  or revoked sooner.  Influenza A by PCR NEGATIVE NEGATIVE Final   Influenza B by PCR NEGATIVE NEGATIVE Final    Comment: (NOTE) The Xpert Xpress SARS-CoV-2/FLU/RSV plus assay is intended as an aid in the diagnosis of influenza from Nasopharyngeal swab specimens and should not be used as a sole basis for treatment. Nasal washings and aspirates are unacceptable for Xpert Xpress SARS-CoV-2/FLU/RSV testing.  Fact Sheet for Patients: BloggerCourse.com  Fact Sheet for Healthcare Providers: SeriousBroker.it  This test is not yet approved or cleared by the [place] FDA and has been authorized for detection and/or diagnosis of SARS-CoV-2 by FDA under an Emergency Use Authorization (EUA). This EUA will remain in effect (meaning this test can be used) for the duration of the COVID-19 declaration under Section 564(b)(1) of the Act, 21 U.S.C. section 360bbb-3(b)(1), unless the authorization is terminated or revoked.  Performed at [facility], [address], [place], [place] [address]   Culture, body fluid w Gram Stain-bottle     Status: None (Preliminary result)   Collection Time: 01/31/21 10:26 AM   Specimen: Fluid  Result Value Ref Range Status   Specimen Description FLUID  Final   Special Requests NONE  Final   Culture   Final    NO GROWTH 2 DAYS Performed at [facility], [address], [place], [place] [address]    Report Status PENDING  Incomplete  Gram stain     Status: None   Collection Time: 01/31/21 10:26 AM   Specimen: Fluid  Result Value Ref Range Status   Specimen Description FLUID  Final   Special Requests NONE  Final   Gram Stain   Final    WBC PRESENT,BOTH  PMN AND MONONUCLEAR NO ORGANISMS SEEN CYTOSPIN SMEAR Performed at [facility], [address], [place], [place] [address]    Report Status 01/31/2021 FINAL  Final      Radiology Studies: No results found.   Scheduled Meds:  chlordiazePOXIDE  25 mg Oral BH-qamhs   Followed by   Melene Muller ON 02/03/2021] chlordiazePOXIDE  25 mg Oral Daily   furosemide  40 mg Intravenous Daily   multivitamin with minerals  1 tablet Oral Daily   pantoprazole  40 mg Oral Q0600   sodium chloride flush  3 mL Intravenous Q12H   spironolactone  50 mg Oral Daily   thiamine  100 mg Oral Daily   Continuous Infusions:   LOS: 2 days   Time spent: 30 minutes   Hughie Closs, MD [facility]  02/02/2021, 2:59 PM   How to contact the [facility] Attending or Consulting provider 7A - 7P or covering provider during after hours 7P -7A, for this patient?  Check the care team in [facility] and look for a) attending/consulting TRH provider listed and b) the [facility] team listed. Page or secure chat 7A-7P. Log into [email or web address] and use [facility] universal password to access. If you do not have the password, please contact the hospital operator. Locate the [facility] provider you are looking for under [facility] and page to a number that you can be directly reached. If you still have difficulty reaching the provider, please page the [facility] (Director on Call) for the Hospitalists listed on amion for assistance.

## 2021-02-02 NOTE — TOC Initial Note (Addendum)
Transition of Care [facility]) - Initial/Assessment Note    Patient Details  Name: Donald Briggs MRN: [id number] Date of Birth: 07/09/1976  Transition of Care [facility]) CM/SW Contact:    Lockie Pares, RN Phone Number: 02/02/2021, 12:19 PM  Clinical Narrative:                 Admitted with anemia, etoh 6 beers a day.  EGD for anemia.  History of alchoholic hepatitis with acites.  Concerning for cirrhosis. On CIWA protocol. And has thrombocytopenia.  Will need a GI consult as outpatient. Patient is inunsured may need MATCH for medications No SSN listed.  Is employed. CM will follow for needs. ,   Hospital follow up appointment made at [facility], already established with Dr Shirlee More  Expected Discharge Plan: Home/Self Care     Patient Goals and CMS Choice        Expected Discharge Plan and Services Expected Discharge Plan: Home/Self Care   Discharge Planning Services: CM Consult   Living arrangements for the past 2 months: Mobile Home                                      Prior Living Arrangements/Services Living arrangements for the past 2 months: Mobile Home Lives with:: Spouse Patient language and need for interpreter reviewed:: Yes        Need for Family Participation in Patient Care: Yes (Comment) Care giver support system in place?: Yes (comment)   Criminal Activity/Legal Involvement Pertinent to Current Situation/Hospitalization: No - Comment as needed  Activities of Daily Living      Permission Sought/Granted                  Emotional Assessment       Orientation: : Oriented to Self, Oriented to Place, Oriented to  Time, Oriented to Situation Alcohol / Substance Use: Alcohol Use Psych Involvement: No (comment)  Admission diagnosis:  Alcoholic hepatitis [K70.10] RUQ abdominal pain [R10.11] Alcoholic hepatitis with ascites [K70.11] Alcoholic cirrhosis of liver with ascites (HCC) [K70.31] Ascites [R18.8] Patient Active Problem List   Diagnosis Date  Noted   Alcoholic cirrhosis of liver with ascites (HCC)    Alcoholic hepatitis with ascites 01/31/2021   Alcohol abuse 01/31/2021   Hyponatremia 01/31/2021   Transaminitis 01/31/2021   Coffee ground emesis 01/31/2021   Portal vein thrombosis 01/31/2021   Hyperammonemia (HCC) 01/31/2021   Coagulopathy (HCC) 01/31/2021   Pulmonary edema 01/31/2021   Tobacco dependence 08/14/2017   PCP:  Claiborne Rigg, NP Pharmacy:   [facility] [address] [place] [facility] [address] [place] [address] Phone: [phone] Fax: [phone]  [facility] [address] [place] [address] Phone: [phone] Fax: [phone]     Social Determinants of Health (SDOH) Interventions    Readmission Risk Interventions No flowsheet data found.

## 2021-02-02 NOTE — Anesthesia Preprocedure Evaluation (Addendum)
Anesthesia Evaluation  Patient identified by MRN, date of birth, ID band Patient awake    Reviewed: Allergy & Precautions, NPO status , Patient's Chart, lab work & pertinent test results  Airway Mallampati: II  TM Distance: >3 FB Neck ROM: Full    Dental   Pulmonary Current Smoker,    Pulmonary exam normal        Cardiovascular negative cardio ROS Normal cardiovascular exam     Neuro/Psych negative neurological ROS  negative psych ROS   GI/Hepatic (+) Cirrhosis     substance abuse (etOH abuse)  alcohol use, Variceal screen for cirrhosis, portal HTN S/p IR paracentesis 01/31/21 GIB-  last vomited 6/7 and reported that emesis has been coffee-ground colored in appearance at times   Endo/Other  negative endocrine ROS  Renal/GU negative Renal ROS  negative genitourinary   Musculoskeletal negative musculoskeletal ROS (+)   Abdominal   Peds  Hematology  (+) Blood dyscrasia, anemia , 11.3/34.5 plt clumped on all recent labs- no recent plt value INR 1.7   Anesthesia Other Findings   Reproductive/Obstetrics negative OB ROS                            Anesthesia Physical Anesthesia Plan  ASA: 3  Anesthesia Plan: MAC   Post-op Pain Management:    Induction: Intravenous  PONV Risk Score and Plan: 1 and Propofol infusion, TIVA and Treatment may vary due to age or medical condition  Airway Management Planned: Natural Airway, Nasal Cannula and Simple Face Mask  Additional Equipment: None  Intra-op Plan:   Post-operative Plan:   Informed Consent: I have reviewed the patients History and Physical, chart, labs and discussed the procedure including the risks, benefits and alternatives for the proposed anesthesia with the patient or authorized representative who has indicated his/her understanding and acceptance.       Plan Discussed with:   Anesthesia Plan Comments:         Anesthesia Quick Evaluation

## 2021-02-02 NOTE — Interval H&P Note (Signed)
History and Physical Interval Note:  02/02/2021 10:29 AM  Donald Briggs  has presented today for surgery, with the diagnosis of Anemia.  Cirrhosis.  Portal hypertension on CT.  Variceal screening.  The various methods of treatment have been discussed with the patient and family. After consideration of risks, benefits and other options for treatment, the patient has consented to  Procedure(s): ESOPHAGOGASTRODUODENOSCOPY (EGD) WITH PROPOFOL (N/A) as a surgical intervention.  The patient's history has been reviewed, patient examined, no change in status, stable for surgery.  I have reviewed the patient's chart and labs.  Questions were answered to the patient's satisfaction.     Venita Lick. Russella Dar

## 2021-02-02 NOTE — Progress Notes (Signed)
  Echo attempted. Patient being transported to endoscopy. Will attempt again later as time permits.

## 2021-02-02 NOTE — Anesthesia Postprocedure Evaluation (Signed)
Anesthesia Post Note  Patient: Donald Briggs  Procedure(s) Performed: ESOPHAGOGASTRODUODENOSCOPY (EGD) WITH PROPOFOL     Patient location during evaluation: Endoscopy Anesthesia Type: MAC Level of consciousness: awake and alert Pain management: pain level controlled Vital Signs Assessment: post-procedure vital signs reviewed and stable Respiratory status: spontaneous breathing, nonlabored ventilation and respiratory function stable Cardiovascular status: blood pressure returned to baseline and stable Postop Assessment: no apparent nausea or vomiting Anesthetic complications: no   No notable events documented.  Last Vitals:  Vitals:   02/02/21 1108 02/02/21 1118  BP: (!) 148/61 (!) 146/95  Pulse: 69 71  Resp: 19 20  Temp:    SpO2: 100% 100%    Last Pain:  Vitals:   02/02/21 1118  TempSrc:   PainSc: 0-No pain                 Lidia Collum

## 2021-02-02 NOTE — Transfer of Care (Signed)
Immediate Anesthesia Transfer of Care Note  Patient: Donald Briggs  Procedure(s) Performed: ESOPHAGOGASTRODUODENOSCOPY (EGD) WITH PROPOFOL  Patient Location: Endoscopy Unit  Anesthesia Type:MAC  Level of Consciousness: drowsy and patient cooperative  Airway & Oxygen Therapy: Patient Spontanous Breathing and Patient connected to face mask oxygen  Post-op Assessment: Report given to RN and Post -op Vital signs reviewed and stable  Post vital signs: Reviewed and stable  Last Vitals:  Vitals Value Taken Time  BP 141/58 02/02/21 1055  Temp 36.8 C 02/02/21 1053  Pulse 53 02/02/21 1055  Resp 16 02/02/21 1055  SpO2 100 % 02/02/21 1055  Vitals shown include unvalidated device data.  Last Pain:  Vitals:   02/02/21 1053  TempSrc: Axillary  PainSc: 0-No pain         Complications: No notable events documented.

## 2021-02-03 ENCOUNTER — Encounter (HOSPITAL_COMMUNITY): Payer: Self-pay | Admitting: Gastroenterology

## 2021-02-03 ENCOUNTER — Encounter: Payer: Self-pay | Admitting: Nurse Practitioner

## 2021-02-03 ENCOUNTER — Other Ambulatory Visit: Payer: Self-pay | Admitting: Nurse Practitioner

## 2021-02-03 DIAGNOSIS — K3189 Other diseases of stomach and duodenum: Secondary | ICD-10-CM

## 2021-02-03 DIAGNOSIS — K7031 Alcoholic cirrhosis of liver with ascites: Secondary | ICD-10-CM

## 2021-02-03 DIAGNOSIS — K766 Portal hypertension: Secondary | ICD-10-CM

## 2021-02-03 LAB — CBC WITH DIFFERENTIAL/PLATELET
Abs Immature Granulocytes: 0.03 10*3/uL (ref 0.00–0.07)
Basophils Absolute: 0.1 10*3/uL (ref 0.0–0.1)
Basophils Relative: 1 %
Eosinophils Absolute: 0.3 10*3/uL (ref 0.0–0.5)
Eosinophils Relative: 4 %
HCT: 34.2 % — ABNORMAL LOW (ref 39.0–52.0)
Hemoglobin: 11.6 g/dL — ABNORMAL LOW (ref 13.0–17.0)
Immature Granulocytes: 1 %
Lymphocytes Relative: 15 %
Lymphs Abs: 0.9 10*3/uL (ref 0.7–4.0)
MCH: 31.9 pg (ref 26.0–34.0)
MCHC: 33.9 g/dL (ref 30.0–36.0)
MCV: 94 fL (ref 80.0–100.0)
Monocytes Absolute: 0.5 10*3/uL (ref 0.1–1.0)
Monocytes Relative: 8 %
Neutro Abs: 4.5 10*3/uL (ref 1.7–7.7)
Neutrophils Relative %: 71 %
Platelets: 74 10*3/uL — ABNORMAL LOW (ref 150–400)
RBC: 3.64 MIL/uL — ABNORMAL LOW (ref 4.22–5.81)
RDW: 16.2 % — ABNORMAL HIGH (ref 11.5–15.5)
WBC: 6.3 10*3/uL (ref 4.0–10.5)
nRBC: 0 % (ref 0.0–0.2)

## 2021-02-03 LAB — CBC
HCT: 38.4 % — ABNORMAL LOW (ref 39.0–52.0)
Hemoglobin: 12.7 g/dL — ABNORMAL LOW (ref 13.0–17.0)
MCH: 31.3 pg (ref 26.0–34.0)
MCHC: 33.1 g/dL (ref 30.0–36.0)
MCV: 94.6 fL (ref 80.0–100.0)
Platelets: 109 10*3/uL — ABNORMAL LOW (ref 150–400)
RBC: 4.06 MIL/uL — ABNORMAL LOW (ref 4.22–5.81)
RDW: 16.4 % — ABNORMAL HIGH (ref 11.5–15.5)
WBC: 7.5 10*3/uL (ref 4.0–10.5)
nRBC: 0 % (ref 0.0–0.2)

## 2021-02-03 LAB — IRON AND TIBC
Iron: 54 ug/dL (ref 45–182)
Saturation Ratios: 26 % (ref 17.9–39.5)
TIBC: 206 ug/dL — ABNORMAL LOW (ref 250–450)
UIBC: 152 ug/dL

## 2021-02-03 LAB — COMPREHENSIVE METABOLIC PANEL
ALT: 23 U/L (ref 0–44)
ALT: 28 U/L (ref 0–44)
AST: 71 U/L — ABNORMAL HIGH (ref 15–41)
AST: 81 U/L — ABNORMAL HIGH (ref 15–41)
Albumin: 1.7 g/dL — ABNORMAL LOW (ref 3.5–5.0)
Albumin: 2 g/dL — ABNORMAL LOW (ref 3.5–5.0)
Alkaline Phosphatase: 184 U/L — ABNORMAL HIGH (ref 38–126)
Alkaline Phosphatase: 199 U/L — ABNORMAL HIGH (ref 38–126)
Anion gap: 8 (ref 5–15)
Anion gap: 9 (ref 5–15)
BUN: 6 mg/dL (ref 6–20)
BUN: 7 mg/dL (ref 6–20)
CO2: 26 mmol/L (ref 22–32)
CO2: 26 mmol/L (ref 22–32)
Calcium: 8.3 mg/dL — ABNORMAL LOW (ref 8.9–10.3)
Calcium: 8.3 mg/dL — ABNORMAL LOW (ref 8.9–10.3)
Chloride: 101 mmol/L (ref 98–111)
Chloride: 97 mmol/L — ABNORMAL LOW (ref 98–111)
Creatinine, Ser: 0.55 mg/dL — ABNORMAL LOW (ref 0.61–1.24)
Creatinine, Ser: 0.71 mg/dL (ref 0.61–1.24)
GFR, Estimated: >60 mL/min (ref >60)
GFR, Estimated: >60 mL/min (ref >60)
Glucose, Bld: 114 mg/dL — ABNORMAL HIGH (ref 70–99)
Glucose, Bld: 138 mg/dL — ABNORMAL HIGH (ref 70–99)
Potassium: 3.8 mmol/L (ref 3.5–5.1)
Potassium: 3.2 mmol/L — ABNORMAL LOW (ref 3.5–5.1)
Sodium: 135 mmol/L (ref 135–145)
Sodium: 132 mmol/L — ABNORMAL LOW (ref 135–145)
Total Bilirubin: 2.9 mg/dL — ABNORMAL HIGH (ref 0.3–1.2)
Total Bilirubin: 3.4 mg/dL — ABNORMAL HIGH (ref 0.3–1.2)
Total Protein: 6.9 g/dL (ref 6.5–8.1)
Total Protein: 7.9 g/dL (ref 6.5–8.1)

## 2021-02-03 LAB — PHOSPHORUS: Phosphorus: 4.3 mg/dL (ref 2.5–4.6)

## 2021-02-03 LAB — MAGNESIUM: Magnesium: 1.7 mg/dL (ref 1.7–2.4)

## 2021-02-03 LAB — FERRITIN: Ferritin: 84 ng/mL (ref 24–336)

## 2021-02-03 MED ORDER — FOLIC ACID 1 MG PO TABS
1.0000 mg | ORAL_TABLET | Freq: Every day | ORAL | Status: DC
  Administered 2021-02-03 – 2021-02-04 (×2): 1 mg via ORAL
  Filled 2021-02-03 (×2): qty 1

## 2021-02-03 MED ORDER — LORAZEPAM 2 MG/ML IJ SOLN: 1.0000 mg | INTRAMUSCULAR | Status: DC | PRN

## 2021-02-03 MED ORDER — CHLORDIAZEPOXIDE HCL 10 MG PO CAPS
10.0000 mg | ORAL_CAPSULE | Freq: Three times a day (TID) | ORAL | Status: DC
  Administered 2021-02-03 – 2021-02-04 (×3): 10 mg via ORAL
  Filled 2021-02-03 (×3): qty 2

## 2021-02-03 MED ORDER — THIAMINE HCL 100 MG/ML IJ SOLN: 100.0000 mg | Freq: Every day | INTRAMUSCULAR | Status: DC

## 2021-02-03 MED ORDER — THIAMINE HCL 100 MG PO TABS
100.0000 mg | ORAL_TABLET | Freq: Every day | ORAL | Status: DC
  Administered 2021-02-04: 100 mg via ORAL
  Filled 2021-02-03: qty 1

## 2021-02-03 MED ORDER — LORAZEPAM 1 MG PO TABS: 1.0000 mg | ORAL_TABLET | ORAL | Status: DC | PRN

## 2021-02-03 MED ORDER — ADULT MULTIVITAMIN W/MINERALS CH: 1.0000 | ORAL_TABLET | Freq: Every day | ORAL | Status: DC

## 2021-02-03 NOTE — Progress Notes (Signed)
PROGRESS NOTE    Donald Briggs  ZOX:[id number] DOB: 1976/01/10 DOA: 01/30/2021 PCP: Claiborne Rigg, NP   Brief Narrative:  Donald Briggs is a 45 y.o. Spanish-speaking male with medical history significant of back pain, alcohol abuse, and tobacco use who presented with complaints of progressively worsening abdominal pain over the last 1 week.  Notes associated symptoms of shortness of breath, eye redness, nausea, vomiting, noted dark material in vomiting, and intermittent blood in stools. He also smokes 4 to 5 cigarettes/day on average.     Upon admission into the emergency department, patient was hemodynamically stable with some thrombocytopenia, alkaline phosphatase 241, AST 125, ALT 30, total bilirubin 3.7, ammonia 75, and PT/INR 17.8/1.5.  Acute hepatitis panel was nonreactive.  Chest x-ray showed pulmonary vascular congestion with mild interstitial edema.  Right upper quadrant ultrasound showed cirrhotic hepatic morphology and portal vein appeared occluded.  Patient was given Librium 100 mg p.o., ibuprofen 800 mg p.o., and Zofran.  Patient admitted to hospital service and GI consulted.  Follow-up CT abdomen ruled out portal vein occlusion.  Assessment & Plan:   Principal Problem:   Alcoholic hepatitis with ascites Active Problems:   Alcohol abuse   Hyponatremia   Transaminitis   Coffee ground emesis   Portal vein thrombosis   Hyperammonemia (HCC)   Coagulopathy (HCC)   Pulmonary edema   Alcoholic cirrhosis of liver with ascites (HCC)   Alcoholic hepatitis with ascites/decompensated liver cirrhosis: Right upper quadrant ultrasound concerning for cirrhotic hepatic morphology with no focal liver lesion identified, moderate ascites, and concern for portal vein occlusion.  CT abdomen ruled out portal vein occlusion.  Serologic workup : Negative AMA and Alpha 1 antitrypsin. HAV total Ab is positive (likely the IgG) . HBsAg negative. HCV Ab negative.  ANA is positive, ASMA weakly  positive and IgG elevated at 2671.   --Need  to complete hepatic serologic evaluation so will obtain tTG, IgA, ferritin, TIBC, ceruloplasmin, HBsAb (check for immunity) --HCC screening: Normal AFP. No findings of HCC on CT scan -- Post paracentesis on 6/7 with 2.2 L fluid yield. No SBP  Shows Maddrey's discriminant  function for alcoholic hepatitis score equals 25.8 using PT control of 13.  MELD Score equals 20.  Underwent paracentesis with removal of 2.2 L of fluid on 01/31/2021.  Avoid NSAIDs.  Continue Aldactone and Lasix.   Suspect hematemesis with nausea  blood in stools: Acute.  Patient reported intermittently seeing coffee-ground material and blood in stools.  Hemoglobin stable.  S/p EGD which shows grade 1 esophageal varices, portal hypertensive gastropathy but normal duodenum and no active source of bleeding.  GI downgraded Protonix to once daily.  Tolerating regular diet.   Pulmonary edema: Acute/acute on chronic diastolic congestive heart failure.  Chest x-ray showed pulmonary edema and patient tells me that he came in with shortness of breath.  He feels better after paracentesis.  Lungs clear.  Echo shows normal ejection fraction but grade 1 diastolic dysfunction.   Hyperammonemia: Acute.  Ammonia elevated but patient alert and oriented.   Alcohol abuse/dependence with alcohol withdrawal: Patient admits to drinking 6 beers per day on average.  Last drink was on Sunday according to patient.  He was started on CIWA protocol with Librium but for some reason, Librium has been expired.  Per chart review, his last CIWA score was done last night and it was 0 however currently on my evaluation, patient is very wobbly, has gross tremors in upper extremities and actively is withdrawing.  He  is also confused as opposed to fully oriented that he was yesterday.  He is not safe to be discharged home.  His daughter is at the bedside and I discussed with her as well.  She agrees with me keeping him in the  hospital and patient also agrees with the plan.  I will resume him back on a low-dose of Librium 10 mg 3 times daily and start him on CIWA protocol with as needed Ativan.   Hyponatremia: Acute.  Resolved and sodium normal.,  likely due to beer Protomania.   Hypokalemia: Resolved.  Thrombocytopenia: Acute on chronic.  Platelets stable.  No signs of bleeding.   Coagulopathy: Acute.  Secondary to liver cirrhosis.  Stable.  Monitor.  DVT prophylaxis: SCDs Start: 01/31/21 [id number]   Code Status: Full Code  Family Communication: Daughter at bedside we will plan of care discussed with patient and the daughter.    Status is: Inpatient  Remains inpatient appropriate because:Ongoing diagnostic testing needed not appropriate for outpatient work up   Dispo: The patient is from: Home              Anticipated d/c is to: Home              Patient currently is not medically stable to d/c.   Difficult to place patient No        Estimated body mass index is 25.66 kg/m as calculated from the following:   Height as of 11/11/18: 5\' 5"  (1.651 m).   Weight as of 11/11/18: 69.9 kg.      Nutritional status:               Consultants:  GI  Procedures:  Paracentesis  Antimicrobials:  Anti-infectives (From admission, onward)    None          Subjective: Patient seen and examined using in person Spanish interpreter.  He states that he is feeling better.  Denied any abdominal pain or shortness of breath.  Wants to go home but understands that he needs to stay in the hospital since he is actively withdrawing from alcohol.  Objective: Vitals:   02/02/21 1804 02/02/21 2026 02/03/21 0532 02/03/21 0919  BP: 120/76 112/77 101/62 96/73  Pulse: 75 65 61 65  Resp: 17 16 16 18   Temp: 98.1 F (36.7 C) 98.1 F (36.7 C) 98.3 F (36.8 C) 98.2 F (36.8 C)  TempSrc: Oral Oral Oral   SpO2: 98% 99% 97% 98%    Intake/Output Summary (Last 24 hours) at 02/03/2021 1114 Last data filed at  02/03/2021 0800 Gross per 24 hour  Intake 820 ml  Output --  Net 820 ml    There were no vitals filed for this visit.  Examination:  General exam: Appears confused and wobbly Respiratory system: Clear to auscultation. Respiratory effort normal. Cardiovascular system: S1 & S2 heard, RRR. No JVD, murmurs, rubs, gallops or clicks. No pedal edema. Gastrointestinal system: Abdomen is nondistended, soft and nontender. No organomegaly or masses felt. Normal bowel sounds heard. Central nervous system: Alert and oriented x1. No focal neurological deficits. Extremities: Gross tremors bilateral upper extremities Skin: No rashes, lesions or ulcers.  Psychiatry: Judgement and insight appear poor   Data Reviewed: I have personally reviewed following labs and imaging studies  CBC: Recent Labs  Lab 01/30/21 2106 01/31/21 1111 01/31/21 1915 02/01/21 0450 02/02/21 0243 02/03/21 0330  WBC 7.5  --   --  6.7 6.6 6.3  NEUTROABS  --   --   --   --  4.8 4.5  HGB 12.2* 10.9* 11.1* 10.9*  10.6* 11.3* 11.6*  HCT 36.6* 32.4* 34.2* 32.2*  32.4* 34.5* 34.2*  MCV 95.6  --   --  94.4 95.0 94.0  PLT 78*  --   --  PLATELET CLUMPS NOTED ON SMEAR, UNABLE TO ESTIMATE PLATELET CLUMPS NOTED ON SMEAR, UNABLE TO ESTIMATE 74*    Basic Metabolic Panel: Recent Labs  Lab 01/30/21 2106 01/31/21 0804 02/01/21 0450 02/01/21 1019 02/02/21 0243 02/03/21 0330  NA 132*  --  136  --  133* 135  K 3.8  --  3.4*  --  3.9 3.8  CL 98  --  103  --  101 101  CO2 26  --  28  --  26 26  GLUCOSE 89  --  104*  --  107* 114*  BUN <5*  --  5*  --  6 6  CREATININE 0.48*  --  0.54*  --  0.63 0.55*  CALCIUM 7.8*  --  7.9*  --  8.1* 8.3*  MG  --  1.8  --  1.9  --   --     GFR: CrCl cannot be calculated (Unknown ideal weight.). Liver Function Tests: Recent Labs  Lab 01/30/21 2106 02/01/21 0450 02/02/21 0243 02/03/21 0330  AST 125* 83* 84* 71*  ALT 30 25 24 23   ALKPHOS 241* 200* 200* 184*  BILITOT 3.7* 3.7* 2.8*  2.9*  PROT 8.3* 7.4 7.5 6.9  ALBUMIN 2.0* 1.7* 1.8* 1.7*    Recent Labs  Lab 01/31/21 0804  LIPASE 39    Recent Labs  Lab 01/30/21 2106  AMMONIA 75*    Coagulation Profile: Recent Labs  Lab 01/30/21 2352 02/02/21 0243  INR 1.5* 1.7*    Cardiac Enzymes: No results for input(s): CKTOTAL, CKMB, CKMBINDEX, TROPONINI in the last 168 hours. BNP (last 3 results) No results for input(s): PROBNP in the last 8760 hours. HbA1C: No results for input(s): HGBA1C in the last 72 hours. CBG: No results for input(s): GLUCAP in the last 168 hours. Lipid Profile: No results for input(s): CHOL, HDL, LDLCALC, TRIG, CHOLHDL, LDLDIRECT in the last 72 hours. Thyroid Function Tests: No results for input(s): TSH, T4TOTAL, FREET4, T3FREE, THYROIDAB in the last 72 hours. Anemia Panel: No results for input(s): VITAMINB12, FOLATE, FERRITIN, TIBC, IRON, RETICCTPCT in the last 72 hours. Sepsis Labs: No results for input(s): PROCALCITON, LATICACIDVEN in the last 168 hours.  Recent Results (from the past 240 hour(s))  Resp Panel by RT-PCR (Flu A&B, Covid) Nasopharyngeal Swab     Status: None   Collection Time: 01/31/21  6:30 AM   Specimen: Nasopharyngeal Swab; Nasopharyngeal(NP) swabs in vial transport medium  Result Value Ref Range Status   SARS Coronavirus 2 by RT PCR NEGATIVE NEGATIVE Final    Comment: (NOTE) SARS-CoV-2 target nucleic acids are NOT DETECTED.  The SARS-CoV-2 RNA is generally detectable in upper respiratory specimens during the acute phase of infection. The lowest concentration of SARS-CoV-2 viral copies this assay can detect is 138 copies/mL. A negative result does not preclude SARS-Cov-2 infection and should not be used as the sole basis for treatment or other patient management decisions. A negative result may occur with  improper specimen collection/handling, submission of specimen other than nasopharyngeal swab, presence of viral mutation(s) within the areas targeted by  this assay, and inadequate number of viral copies(<138 copies/mL). A negative result must be combined with clinical observations, patient history, and epidemiological information. The expected result is Negative.  Fact  Sheet for Patients:  BloggerCourse.com  Fact Sheet for Healthcare Providers:  SeriousBroker.it  This test is no t yet approved or cleared by the [place] FDA and  has been authorized for detection and/or diagnosis of SARS-CoV-2 by FDA under an Emergency Use Authorization (EUA). This EUA will remain  in effect (meaning this test can be used) for the duration of the COVID-19 declaration under Section 564(b)(1) of the Act, 21 U.S.C.section 360bbb-3(b)(1), unless the authorization is terminated  or revoked sooner.       Influenza A by PCR NEGATIVE NEGATIVE Final   Influenza B by PCR NEGATIVE NEGATIVE Final    Comment: (NOTE) The Xpert Xpress SARS-CoV-2/FLU/RSV plus assay is intended as an aid in the diagnosis of influenza from Nasopharyngeal swab specimens and should not be used as a sole basis for treatment. Nasal washings and aspirates are unacceptable for Xpert Xpress SARS-CoV-2/FLU/RSV testing.  Fact Sheet for Patients: BloggerCourse.com  Fact Sheet for Healthcare Providers: SeriousBroker.it  This test is not yet approved or cleared by the [place] FDA and has been authorized for detection and/or diagnosis of SARS-CoV-2 by FDA under an Emergency Use Authorization (EUA). This EUA will remain in effect (meaning this test can be used) for the duration of the COVID-19 declaration under Section 564(b)(1) of the Act, 21 U.S.C. section 360bbb-3(b)(1), unless the authorization is terminated or revoked.  Performed at [facility], [address], [place], [place] [address]   Culture, body fluid w Gram Stain-bottle     Status: None (Preliminary result)    Collection Time: 01/31/21 10:26 AM   Specimen: Fluid  Result Value Ref Range Status   Specimen Description FLUID  Final   Special Requests NONE  Final   Culture   Final    NO GROWTH 3 DAYS Performed at [facility], [address], [place], [place] [address]    Report Status PENDING  Incomplete  Gram stain     Status: None   Collection Time: 01/31/21 10:26 AM   Specimen: Fluid  Result Value Ref Range Status   Specimen Description FLUID  Final   Special Requests NONE  Final   Gram Stain   Final    WBC PRESENT,BOTH PMN AND MONONUCLEAR NO ORGANISMS SEEN CYTOSPIN SMEAR Performed at [facility], [address] [place], [place] [address]    Report Status 01/31/2021 FINAL  Final      Radiology Studies: ECHOCARDIOGRAM COMPLETE  Result Date: 02/02/2021    ECHOCARDIOGRAM REPORT   Patient Name:   Donald Briggs Date of Exam: 02/02/2021 Medical Rec #:  [id number]      Height:       65.0 in Accession #:    [id number]     Weight:       154.2 lb Date of Birth:  1975-12-05      BSA:          1.771 m Patient Age:    45 years       BP:           145/95 mmHg Patient Gender: M              HR:           67 bpm. Exam Location:  Inpatient Procedure: Cardiac Doppler, 2D Echo and Color Doppler Indications:    CHF-Acute Diastolic  History:        Patient has no prior history of Echocardiogram examinations.  Risk Factors:Current Smoker. ETOH abuse.  Sonographer:    Ross Ludwig RDCS (AE) Referring Phys: [id number] [facility]  Sonographer Comments: Suboptimal subcostal window. IMPRESSIONS  1. Left ventricular ejection fraction, by estimation, is 55 to 60%. The left ventricle has normal function. The left ventricle has no regional wall motion abnormalities. Left ventricular diastolic parameters are consistent with Grade I diastolic dysfunction (impaired relaxation).  2. Right ventricular systolic function is normal. The right ventricular size is normal. Tricuspid regurgitation signal is  inadequate for assessing PA pressure.  3. The mitral valve is degenerative. No evidence of mitral valve regurgitation. No evidence of mitral stenosis.  4. The aortic valve is tricuspid. Aortic valve regurgitation is not visualized. Mild aortic valve sclerosis is present, with no evidence of aortic valve stenosis.  5. The inferior vena cava is normal in size with greater than 50% respiratory variability, suggesting right atrial pressure of 3 mmHg. FINDINGS  Left Ventricle: Left ventricular ejection fraction, by estimation, is 55 to 60%. The left ventricle has normal function. The left ventricle has no regional wall motion abnormalities. The left ventricular internal cavity size was normal in size. There is  no left ventricular hypertrophy. Left ventricular diastolic parameters are consistent with Grade I diastolic dysfunction (impaired relaxation). Right Ventricle: The right ventricular size is normal. No increase in right ventricular wall thickness. Right ventricular systolic function is normal. Tricuspid regurgitation signal is inadequate for assessing PA pressure. Left Atrium: Left atrial size was normal in size. Right Atrium: Right atrial size was normal in size. Pericardium: There is no evidence of pericardial effusion. Mitral Valve: The mitral valve is degenerative in appearance. There is mild calcification of the mitral valve leaflet(s). Mild mitral annular calcification. No evidence of mitral valve regurgitation. No evidence of mitral valve stenosis. MV peak gradient, 3.4 mmHg. The mean mitral valve gradient is 1.0 mmHg. Tricuspid Valve: The tricuspid valve is normal in structure. Tricuspid valve regurgitation is not demonstrated. Aortic Valve: The aortic valve is tricuspid. Aortic valve regurgitation is not visualized. Mild aortic valve sclerosis is present, with no evidence of aortic valve stenosis. Aortic valve mean gradient measures 4.0 mmHg. Aortic valve peak gradient measures 7.3 mmHg. Aortic valve  area, by VTI measures 3.02 cm. Pulmonic Valve: The pulmonic valve was normal in structure. Pulmonic valve regurgitation is not visualized. Aorta: The aortic root is normal in size and structure. Venous: The inferior vena cava is normal in size with greater than 50% respiratory variability, suggesting right atrial pressure of 3 mmHg. IAS/Shunts: No atrial level shunt detected by color flow Doppler.  LEFT VENTRICLE PLAX 2D LVIDd:         5.20 cm  Diastology LVIDs:         2.70 cm  LV e' medial:    6.31 cm/s LV PW:         1.10 cm  LV E/e' medial:  9.3 LV IVS:        0.90 cm  LV e' lateral:   6.31 cm/s LVOT diam:     2.10 cm  LV E/e' lateral: 9.3 LV SV:         65 LV SV Index:   37 LVOT Area:     3.46 cm  RIGHT VENTRICLE RV Basal diam:  2.60 cm RV S prime:     21.40 cm/s TAPSE (M-mode): 2.7 cm LEFT ATRIUM             Index       RIGHT ATRIUM  Index LA diam:        3.60 cm 2.03 cm/m  RA Area:     11.00 cm LA Vol (A2C):   35.0 ml 19.76 ml/m RA Volume:   19.40 ml  10.95 ml/m LA Vol (A4C):   49.2 ml 27.78 ml/m LA Biplane Vol: 44.0 ml 24.84 ml/m  AORTIC VALVE AV Area (Vmax):    2.36 cm AV Area (Vmean):   2.51 cm AV Area (VTI):     3.02 cm AV Vmax:           135.00 cm/s AV Vmean:          92.500 cm/s AV VTI:            0.217 m AV Peak Grad:      7.3 mmHg AV Mean Grad:      4.0 mmHg LVOT Vmax:         92.00 cm/s LVOT Vmean:        67.100 cm/s LVOT VTI:          0.189 m LVOT/AV VTI ratio: 0.87  AORTA Ao Root diam: 3.60 cm Ao Asc diam:  2.70 cm MITRAL VALVE MV Area (PHT): 3.31 cm    SHUNTS MV Area VTI:   2.35 cm    Systemic VTI:  0.19 m MV Peak grad:  3.4 mmHg    Systemic Diam: 2.10 cm MV Mean grad:  1.0 mmHg MV Vmax:       0.93 m/s MV Vmean:      53.4 cm/s MV Decel Time: 229 msec MV E velocity: 58.70 cm/s MV A velocity: 55.70 cm/s MV E/A ratio:  1.05 Marca Ancona MD Electronically signed by Marca Ancona MD Signature Date/Time: 02/02/2021/4:55:07 PM    Final      Scheduled Meds:  furosemide  40 mg  Intravenous Daily   multivitamin with minerals  1 tablet Oral Daily   pantoprazole  40 mg Oral Q0600   sodium chloride flush  3 mL Intravenous Q12H   spironolactone  50 mg Oral Daily   thiamine  100 mg Oral Daily   Continuous Infusions:   LOS: 3 days   Time spent: 33 minutes   Hughie Closs, MD [facility]  02/03/2021, 11:14 AM   How to contact the [facility] Attending or Consulting provider 7A - 7P or covering provider during after hours 7P -7A, for this patient?  Check the care team in [facility] and look for a) attending/consulting [facility] provider listed and b) the [facility] team listed. Page or secure chat 7A-7P. Log into [email or web address] and use [place]'s universal password to access. If you do not have the password, please contact the hospital operator. Locate the [facility] provider you are looking for under [facility] and page to a number that you can be directly reached. If you still have difficulty reaching the provider, please page the [facility] (Director on Call) for the Hospitalists listed on amion for assistance.

## 2021-02-03 NOTE — Progress Notes (Addendum)
Progress Note  Chief Complaint:    nausea, vomiting, new diagnosis of cirrhosis.      ASSESSMENT / PLAN:    Brief History: 45 yo male with past medical history significant for Etoh abuse admitted several days ago for abdominal pain / distention, loose stool, chest pain / vomiting  / [place] suggesting cirrhosis.   #  Decompensated cirrhosis, with ascites and grade I esophageal varices and portal gastropathy on EGD. Cirrhosis probably due to Etoh but markers for autoimmune disease are elevated.  --Echo: grade I DD, EF 55% --Serologic workup : Negative AMA and Alpha 1 antitrypsin. HAV total Ab is positive (likely the IgG) . HBsAg negative. HCV Ab negative.  ANA is positive, ASMA weakly positive and IgG elevated at 2671.   --Need  to complete hepatic serologic evaluation so will obtain tTG, IgA, ferritin, TIBC, ceruloplasmin, HBsAb (check for immunity) --HCC screening: Normal AFP. No findings of HCC on CT scan  --Paracentesis on 6/7. No SBP. Fluid studies not c/w SBP.  SAAG ~ 0.9, no c/w portal htn but calculation compromised by not having serum albumin from 6/7 when fluid albumin was drawn and non-specific # for serum albumin (reported as less than 1).  If SAAG calculated using serum albumin of 0.7 he meets criteria for portal htn but if using 0.9, does not meet criteria. Cytology negative for malignancy.  --Continue lasix, aldactone.  --labs at our office next Friday. Please go to [facility] office at [address] [place], [place] [address] between the hours of 7:30 am and 4:00 pm to have labs drawn. Our lab is located in the basement of the building.  --Follow up office visit with me 7/13 at 8:30am.  --Needs to be on 2 gram sodium diet at home upon discharge.  --No Etoh use  # Nausea / vomiting / hematemesis. EGD >>> portal gastropathy with oozing. No further hematemesis.  --Continue qd PPI --No NSAIDS.  --Hgb stable at 11.6  --Wants diet advancement. Will give 2 gram sodium  diet.      Attending Physician Note   I have taken an interval history, reviewed the chart and examined the patient. I agree with the Advanced Practitioner's note, impression and recommendations.   The pattern of LFT elevation is typical of alcoholic hepatitis however further serologic evaluation is indicated given positive ANA, ASMA and elevated IgG. Liver biopsy might be necessary as an outpatient. OK for discharge today from GI standpoint with medications and GI follow up as outlined. He needs close outpatient follow up with his PCP, Loreen Freud, NP as well.    Claudette Head, MD FACG [phone]        SUBJECTIVE:   No further nausea / vomiting. Tolerating clears, wants diet advanced.     OBJECTIVE:    Scheduled inpatient medications:   chlordiazePOXIDE  25 mg Oral Daily   furosemide  40 mg Intravenous Daily   multivitamin with minerals  1 tablet Oral Daily   pantoprazole  40 mg Oral Q0600   sodium chloride flush  3 mL Intravenous Q12H   spironolactone  50 mg Oral Daily   thiamine  100 mg Oral Daily   Continuous inpatient infusions:  PRN inpatient medications: albuterol, ondansetron **OR** ondansetron (ZOFRAN) IV  Vital signs in last 24 hours: Temp:  [98.1 F (36.7 C)-98.3 F (36.8 C)] 98.2 F (36.8 C) (06/10 0919) Pulse Rate:  [50-75] 65 (06/10 0919) Resp:  [14-24] 18 (06/10 0919) BP: (75-148)/(32-95) 96/73 (06/10 0919)  SpO2:  [97 %-100 %] 98 % (06/10 0919)    Intake/Output Summary (Last 24 hours) at 02/03/2021 0944 Last data filed at 02/02/2021 2200 Gross per 24 hour  Intake 820 ml  Output --  Net 820 ml     Physical Exam:  General: Alert male in NAD Heart:  Regular rate and rhythm. No lower extremity edema Pulmonary: Normal respiratory effort Abdomen: Soft, nondistended, nontender. Normal bowel sounds.  Neurologic: Alert and oriented Psych: Pleasant. Cooperative.   There were no vitals filed for this visit.  Intake/Output from previous  day: 06/09 0701 - 06/10 0700 In: 823 [P.O.:520; I.V.:303] Out: -  Intake/Output this shift: No intake/output data recorded.    Lab Results: Recent Labs    02/01/21 0450 02/02/21 0243 02/03/21 0330  WBC 6.7 6.6 6.3  HGB 10.9*  10.6* 11.3* 11.6*  HCT 32.2*  32.4* 34.5* 34.2*  PLT PLATELET CLUMPS NOTED ON SMEAR, UNABLE TO ESTIMATE PLATELET CLUMPS NOTED ON SMEAR, UNABLE TO ESTIMATE 74*   BMET Recent Labs    02/01/21 0450 02/02/21 0243 02/03/21 0330  NA 136 133* 135  K 3.4* 3.9 3.8  CL 103 101 101  CO2 28 26 26   GLUCOSE 104* 107* 114*  BUN 5* 6 6  CREATININE 0.54* 0.63 0.55*  CALCIUM 7.9* 8.1* 8.3*   LFT Recent Labs    02/03/21 0330  PROT 6.9  ALBUMIN 1.7*  AST 71*  ALT 23  ALKPHOS 184*  BILITOT 2.9*   PT/INR Recent Labs    02/02/21 0243  LABPROT 20.3*  INR 1.7*   Hepatitis Panel No results for input(s): HEPBSAG, HCVAB, HEPAIGM, HEPBIGM in the last 72 hours.  ECHOCARDIOGRAM COMPLETE  Result Date: 02/02/2021    ECHOCARDIOGRAM REPORT   Patient Name:   Donald Briggs Date of Exam: 02/02/2021 Medical Rec #:  [id number]      Height:       65.0 in Accession #:    [id number]     Weight:       154.2 lb Date of Birth:  Sep 10, 1975      BSA:          1.771 m Patient Age:    45 years       BP:           145/95 mmHg Patient Gender: M              HR:           67 bpm. Exam Location:  Inpatient Procedure: Cardiac Doppler, 2D Echo and Color Doppler Indications:    CHF-Acute Diastolic  History:        Patient has no prior history of Echocardiogram examinations.                 Risk Factors:Current Smoker. ETOH abuse.  Sonographer:    Ross LudwigArthur Guy RDCS (AE) Referring Phys: [id number] [facility]  Sonographer Comments: Suboptimal subcostal window. IMPRESSIONS  1. Left ventricular ejection fraction, by estimation, is 55 to 60%. The left ventricle has normal function. The left ventricle has no regional wall motion abnormalities. Left ventricular diastolic parameters are consistent with  Grade I diastolic dysfunction (impaired relaxation).  2. Right ventricular systolic function is normal. The right ventricular size is normal. Tricuspid regurgitation signal is inadequate for assessing PA pressure.  3. The mitral valve is degenerative. No evidence of mitral valve regurgitation. No evidence of mitral stenosis.  4. The aortic valve is tricuspid. Aortic valve regurgitation is not visualized. Mild aortic valve  sclerosis is present, with no evidence of aortic valve stenosis.  5. The inferior vena cava is normal in size with greater than 50% respiratory variability, suggesting right atrial pressure of 3 mmHg. FINDINGS  Left Ventricle: Left ventricular ejection fraction, by estimation, is 55 to 60%. The left ventricle has normal function. The left ventricle has no regional wall motion abnormalities. The left ventricular internal cavity size was normal in size. There is  no left ventricular hypertrophy. Left ventricular diastolic parameters are consistent with Grade I diastolic dysfunction (impaired relaxation). Right Ventricle: The right ventricular size is normal. No increase in right ventricular wall thickness. Right ventricular systolic function is normal. Tricuspid regurgitation signal is inadequate for assessing PA pressure. Left Atrium: Left atrial size was normal in size. Right Atrium: Right atrial size was normal in size. Pericardium: There is no evidence of pericardial effusion. Mitral Valve: The mitral valve is degenerative in appearance. There is mild calcification of the mitral valve leaflet(s). Mild mitral annular calcification. No evidence of mitral valve regurgitation. No evidence of mitral valve stenosis. MV peak gradient, 3.4 mmHg. The mean mitral valve gradient is 1.0 mmHg. Tricuspid Valve: The tricuspid valve is normal in structure. Tricuspid valve regurgitation is not demonstrated. Aortic Valve: The aortic valve is tricuspid. Aortic valve regurgitation is not visualized. Mild aortic  valve sclerosis is present, with no evidence of aortic valve stenosis. Aortic valve mean gradient measures 4.0 mmHg. Aortic valve peak gradient measures 7.3 mmHg. Aortic valve area, by VTI measures 3.02 cm. Pulmonic Valve: The pulmonic valve was normal in structure. Pulmonic valve regurgitation is not visualized. Aorta: The aortic root is normal in size and structure. Venous: The inferior vena cava is normal in size with greater than 50% respiratory variability, suggesting right atrial pressure of 3 mmHg. IAS/Shunts: No atrial level shunt detected by color flow Doppler.  LEFT VENTRICLE PLAX 2D LVIDd:         5.20 cm  Diastology LVIDs:         2.70 cm  LV e' medial:    6.31 cm/s LV PW:         1.10 cm  LV E/e' medial:  9.3 LV IVS:        0.90 cm  LV e' lateral:   6.31 cm/s LVOT diam:     2.10 cm  LV E/e' lateral: 9.3 LV SV:         65 LV SV Index:   37 LVOT Area:     3.46 cm  RIGHT VENTRICLE RV Basal diam:  2.60 cm RV S prime:     21.40 cm/s TAPSE (M-mode): 2.7 cm LEFT ATRIUM             Index       RIGHT ATRIUM           Index LA diam:        3.60 cm 2.03 cm/m  RA Area:     11.00 cm LA Vol (A2C):   35.0 ml 19.76 ml/m RA Volume:   19.40 ml  10.95 ml/m LA Vol (A4C):   49.2 ml 27.78 ml/m LA Biplane Vol: 44.0 ml 24.84 ml/m  AORTIC VALVE AV Area (Vmax):    2.36 cm AV Area (Vmean):   2.51 cm AV Area (VTI):     3.02 cm AV Vmax:           135.00 cm/s AV Vmean:          92.500 cm/s AV VTI:  0.217 m AV Peak Grad:      7.3 mmHg AV Mean Grad:      4.0 mmHg LVOT Vmax:         92.00 cm/s LVOT Vmean:        67.100 cm/s LVOT VTI:          0.189 m LVOT/AV VTI ratio: 0.87  AORTA Ao Root diam: 3.60 cm Ao Asc diam:  2.70 cm MITRAL VALVE MV Area (PHT): 3.31 cm    SHUNTS MV Area VTI:   2.35 cm    Systemic VTI:  0.19 m MV Peak grad:  3.4 mmHg    Systemic Diam: 2.10 cm MV Mean grad:  1.0 mmHg MV Vmax:       0.93 m/s MV Vmean:      53.4 cm/s MV Decel Time: 229 msec MV E velocity: 58.70 cm/s MV A velocity: 55.70 cm/s  MV E/A ratio:  1.05 Marca Ancona MD Electronically signed by Marca Ancona MD Signature Date/Time: 02/02/2021/4:55:07 PM    Final       LOS: 3 days   Willette Cluster, NP 02/03/2021, 9:44 AM

## 2021-02-03 NOTE — Plan of Care (Signed)
  Problem: Activity: Goal: Risk for activity intolerance will decrease Outcome: Progressing   Problem: Nutrition: Goal: Adequate nutrition will be maintained Outcome: Progressing   

## 2021-02-04 LAB — CBC WITH DIFFERENTIAL/PLATELET
Abs Immature Granulocytes: 0.02 10*3/uL (ref 0.00–0.07)
Basophils Absolute: 0.1 10*3/uL (ref 0.0–0.1)
Basophils Relative: 1 %
Eosinophils Absolute: 0.3 10*3/uL (ref 0.0–0.5)
Eosinophils Relative: 5 %
HCT: 34.1 % — ABNORMAL LOW (ref 39.0–52.0)
Hemoglobin: 11.5 g/dL — ABNORMAL LOW (ref 13.0–17.0)
Immature Granulocytes: 0 %
Lymphocytes Relative: 17 %
Lymphs Abs: 1 10*3/uL (ref 0.7–4.0)
MCH: 31.9 pg (ref 26.0–34.0)
MCHC: 33.7 g/dL (ref 30.0–36.0)
MCV: 94.5 fL (ref 80.0–100.0)
Monocytes Absolute: 0.7 10*3/uL (ref 0.1–1.0)
Monocytes Relative: 11 %
Neutro Abs: 4 10*3/uL (ref 1.7–7.7)
Neutrophils Relative %: 66 %
Platelets: 92 10*3/uL — ABNORMAL LOW (ref 150–400)
RBC: 3.61 MIL/uL — ABNORMAL LOW (ref 4.22–5.81)
RDW: 16.1 % — ABNORMAL HIGH (ref 11.5–15.5)
WBC: 6 10*3/uL (ref 4.0–10.5)
nRBC: 0 % (ref 0.0–0.2)

## 2021-02-04 LAB — HEPATITIS B SURFACE ANTIBODY, QUANTITATIVE: Hep B S AB Quant (Post): 5.1 m[IU]/mL — ABNORMAL LOW (ref >9.9)

## 2021-02-04 LAB — CERULOPLASMIN: Ceruloplasmin: 25.6 mg/dL (ref 16.0–31.0)

## 2021-02-04 MED ORDER — FUROSEMIDE 40 MG PO TABS: 40.0000 mg | ORAL_TABLET | Freq: Every day | ORAL | 0 refills | Status: DC

## 2021-02-04 MED ORDER — SPIRONOLACTONE 50 MG PO TABS: 50.0000 mg | ORAL_TABLET | Freq: Every day | ORAL | 0 refills | Status: DC

## 2021-02-04 MED ORDER — OMEPRAZOLE 40 MG PO CPDR: 40.0000 mg | DELAYED_RELEASE_CAPSULE | Freq: Every day | ORAL | 0 refills | Status: DC

## 2021-02-04 NOTE — Discharge Summary (Signed)
HomePhysician Discharge Summary  Donald Briggs WNI:[id number] DOB: [birth year] DOA: 01/30/2021  PCP: Claiborne Rigg, NP  Admit date: 01/30/2021 Discharge date: 02/04/2021 30 Day Unplanned Readmission Risk Score    Flowsheet Row ED to Hosp-Admission (Current) from 01/30/2021 in [facility]  30 Day Unplanned Readmission Risk Score (%) 10.58 Filed at 02/04/2021 0800       This score is the patient's risk of an unplanned readmission within 30 days of being discharged (0 -100%). The score is based on dignosis, age, lab data, medications, orders, and past utilization.   Low:  0-14.9   Medium: 15-21.9   High: 22-29.9   Extreme: 30 and above           Admitted From: Home Disposition: Home  Recommendations for Outpatient Follow-up:  Follow up with PCP in 1-2 weeks Follow-up with GI on 03/08/2021 Please obtain BMP/CBC in one week Please follow up with your PCP on the following pending results: Unresulted Labs (From admission, onward)     Start     Ordered   02/03/21 1008  Glia (IgA/G) + tTG IgA  Once,   R        02/03/21 1007   02/02/21 0500  CBC with Differential/Platelet  Daily,   R      02/01/21 1009   01/31/21 0853  Occult blood card to lab, stool  Once,   STAT        01/31/21 0852              Home Health: None Equipment/Devices: None  Discharge Condition: Stable CODE STATUS: Full code Diet recommendation: Low-sodium  Subjective: Seen and examined.  He is now fully alert and oriented.  Although he still has very mild and fine upper extremity tremors but he is able to walk without any support.  He wants to go home.  Brief/Interim Summary: Donald Briggs is a 45 y.o. Spanish-speaking male with medical history significant of back pain, alcohol abuse, and tobacco use who presented with complaints of progressively worsening abdominal pain over the last 1 week.  Notes associated symptoms of shortness of breath, eye redness, nausea, vomiting, noted dark  material in vomiting, and intermittent blood in stools. He also smokes 4 to 5 cigarettes/day on average.     Upon admission into the emergency department, patient was hemodynamically stable with some thrombocytopenia, alkaline phosphatase 241, AST 125, ALT 30, total bilirubin 3.7, ammonia 75, and PT/INR 17.8/1.5.  Acute hepatitis panel was nonreactive.  Chest x-ray showed pulmonary vascular congestion with mild interstitial edema.  Right upper quadrant ultrasound showed cirrhotic hepatic morphology and portal vein appeared occluded.  Patient was given Librium 100 mg p.o., ibuprofen 800 mg p.o., and Zofran.  Patient admitted to hospital service and GI consulted.  Follow-up CT abdomen ruled out portal vein occlusion.  Serologic workup : Negative AMA and Alpha 1 antitrypsin. HAV total Ab is positive (likely the IgG) . HBsAg negative. HCV Ab negative.  ANA is positive, ASMA weakly positive and IgG elevated at 2671.   --Need  to complete hepatic serologic evaluation so will obtain tTG, IgA, ferritin, TIBC, ceruloplasmin, HBsAb (check for immunity) --HCC screening: Normal AFP. No findings of HCC on CT scan -- Status post paracentesis on 6/7 with 2.2 L fluid yield. No SBP  Shows Maddrey's discriminant  function for alcoholic hepatitis score equals 25.8 using PT control of 13.  MELD Score equals 20.  He was started on Aldactone and Lasix.  Avoid NSAIDs.  Continue  Aldactone and Lasix.  S/p EGD which shows grade 1 esophageal varices, portal hypertensive gastropathy but normal duodenum and no active source of bleeding.  GI downgraded Protonix to once daily.  Tolerating regular diet.  GI recommended outpatient follow-up and he has a scheduled appointment in July.  Pulmonary edema: Acute/acute on chronic diastolic congestive heart failure.  Chest x-ray showed pulmonary edema.  Lungs auscultation when I assumed his care.  He felt better after paracentesis.  Echo shows normal ejection fraction but grade 1 diastolic  dysfunction.   Hyperammonemia: Acute.  Ammonia elevated but patient alert and oriented.   Alcohol abuse/dependence with alcohol withdrawal: Patient admits to drinking 6 beers per day on average.  Last drink was on Sunday according to patient.  Patient was started on CIWA protocol with Librium.  He had minimal withdrawal signs but yesterday on 02/03/2021, he really was withdrawing and he was very much confused.  He was continued on scheduled Librium and was started on as needed Ativan based off of CIWA protocol.  Patient CIWA has been less than 5 in last 24 hours and for that reason, he has not qualified for any as needed Ativan.  This morning he is very alert and oriented.  Although he still has very fine tremors in upper extremities and his CIWA is basically 2 based off of that and he does not have any other withdrawal symptoms and is able to walk in front of me in a straight lines.  He is stable for discharge.  Although he might benefit for 1-2 more days of low-dose of Librium however he is not reliable enough to be prescribed that medication and I fear that he would continue to work in [employer] and may also continue to drive which will put him and others at risk if he were to be prescribed on Librium.  For that reason, no Librium at discharge.  I have had lengthy discussion with him where I counseled him to quit alcohol altogether.  He verbalizes understanding but does not seem to be committed.   Acute on chronic hyponatremia: Sodium is stable.  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   Hypokalemia: Resolved.   Thrombocytopenia: Acute on chronic.  Secondary to chronic alcoholism.  Platelets stable.  No signs of bleeding.   Coagulopathy: Acute.  Secondary to liver cirrhosis.  Stable.  Monitor.  Discharge Diagnoses:  Principal Problem:   Alcoholic hepatitis with ascites Active Problems:   Alcohol abuse   Hyponatremia   Transaminitis   Coffee ground emesis   Portal vein thrombosis   Hyperammonemia  (HCC)   Coagulopathy (HCC)   Pulmonary edema   Alcoholic cirrhosis of liver with ascites [facility])    Discharge Instructions   Allergies as of 02/04/2021   No Known Allergies      Medication List     STOP taking these medications    cyclobenzaprine 10 MG tablet Commonly known as: FLEXERIL   hydroxypropyl methylcellulose / hypromellose 2.5 % ophthalmic solution Commonly known as: ISOPTO TEARS / GONIOVISC   naproxen 375 MG tablet Commonly known as: NAPROSYN   pantoprazole 20 MG tablet Commonly known as: PROTONIX       TAKE these medications    furosemide 40 MG tablet Commonly known as: Lasix Take 1 tablet (40 mg total) by mouth daily.   omeprazole 40 MG capsule Commonly known as: PRILOSEC Take 1 capsule (40 mg total) by mouth daily.   spironolactone 50 MG tablet Commonly known as: Aldactone Take 1 tablet (50 mg  total) by mouth daily.        Follow-up Information     Meredith Pel, NP Follow up on 03/08/2021.   Specialty: Gastroenterology Why: at 8:30 am. Please arrive 10 early for appointment Contact information: [address] [place] [address] [phone]         Claiborne Rigg, NP. Go on 03/15/2021.   Specialty: Nurse Practitioner Why: July 20th a 230pm with zelda [facility] for HOspital follow up Contact information: [address] [place] [address] [phone]         Claiborne Rigg, NP Follow up in 1 week(s).   Specialty: Nurse Practitioner Contact information: [address] [place] [address] [phone]                No Known Allergies  Consultations: GI   Procedures/Studies: DG Chest 2 View  Result Date: 01/30/2021 CLINICAL DATA:  Right upper quadrant pain and diarrhea. EXAM: CHEST - 2 VIEW COMPARISON:  December 24, 2013 FINDINGS: Mild, diffusely increased interstitial lung markings are seen. There is no evidence of pleural effusion or pneumothorax. Prominence of the bilateral perihilar  pulmonary vasculature is seen. The heart size and mediastinal contours are within normal limits. Chronic sclerotic changes are seen along the eighth right rib. IMPRESSION: Pulmonary vascular congestion with mild interstitial edema. Electronically Signed   By: Aram Candela M.D.   On: 01/30/2021 21:18   ECHOCARDIOGRAM COMPLETE  Result Date: 02/02/2021    ECHOCARDIOGRAM REPORT   Patient Name:   RAYEN DAFOE Date of Exam: 02/02/2021 Medical Rec #:  [id number]      Height:       65.0 in Accession #:    [id number]     Weight:       154.2 lb Date of Birth:  25-Feb-1976      BSA:          1.771 m Patient Age:    45 years       BP:           145/95 mmHg Patient Gender: M              HR:           67 bpm. Exam Location:  Inpatient Procedure: Cardiac Doppler, 2D Echo and Color Doppler Indications:    CHF-Acute Diastolic  History:        Patient has no prior history of Echocardiogram examinations.                 Risk Factors:Current Smoker. ETOH abuse.  Sonographer:    Ross Ludwig RDCS (AE) Referring Phys: [id number] [facility]  Sonographer Comments: Suboptimal subcostal window. IMPRESSIONS  1. Left ventricular ejection fraction, by estimation, is 55 to 60%. The left ventricle has normal function. The left ventricle has no regional wall motion abnormalities. Left ventricular diastolic parameters are consistent with Grade I diastolic dysfunction (impaired relaxation).  2. Right ventricular systolic function is normal. The right ventricular size is normal. Tricuspid regurgitation signal is inadequate for assessing PA pressure.  3. The mitral valve is degenerative. No evidence of mitral valve regurgitation. No evidence of mitral stenosis.  4. The aortic valve is tricuspid. Aortic valve regurgitation is not visualized. Mild aortic valve sclerosis is present, with no evidence of aortic valve stenosis.  5. The inferior vena cava is normal in size with greater than 50% respiratory variability, suggesting right atrial pressure of  3 mmHg. FINDINGS  Left Ventricle: Left ventricular ejection fraction,  by estimation, is 55 to 60%. The left ventricle has normal function. The left ventricle has no regional wall motion abnormalities. The left ventricular internal cavity size was normal in size. There is  no left ventricular hypertrophy. Left ventricular diastolic parameters are consistent with Grade I diastolic dysfunction (impaired relaxation). Right Ventricle: The right ventricular size is normal. No increase in right ventricular wall thickness. Right ventricular systolic function is normal. Tricuspid regurgitation signal is inadequate for assessing PA pressure. Left Atrium: Left atrial size was normal in size. Right Atrium: Right atrial size was normal in size. Pericardium: There is no evidence of pericardial effusion. Mitral Valve: The mitral valve is degenerative in appearance. There is mild calcification of the mitral valve leaflet(s). Mild mitral annular calcification. No evidence of mitral valve regurgitation. No evidence of mitral valve stenosis. MV peak gradient, 3.4 mmHg. The mean mitral valve gradient is 1.0 mmHg. Tricuspid Valve: The tricuspid valve is normal in structure. Tricuspid valve regurgitation is not demonstrated. Aortic Valve: The aortic valve is tricuspid. Aortic valve regurgitation is not visualized. Mild aortic valve sclerosis is present, with no evidence of aortic valve stenosis. Aortic valve mean gradient measures 4.0 mmHg. Aortic valve peak gradient measures 7.3 mmHg. Aortic valve area, by VTI measures 3.02 cm. Pulmonic Valve: The pulmonic valve was normal in structure. Pulmonic valve regurgitation is not visualized. Aorta: The aortic root is normal in size and structure. Venous: The inferior vena cava is normal in size with greater than 50% respiratory variability, suggesting right atrial pressure of 3 mmHg. IAS/Shunts: No atrial level shunt detected by color flow Doppler.  LEFT VENTRICLE PLAX 2D LVIDd:         5.20  cm  Diastology LVIDs:         2.70 cm  LV e' medial:    6.31 cm/s LV PW:         1.10 cm  LV E/e' medial:  9.3 LV IVS:        0.90 cm  LV e' lateral:   6.31 cm/s LVOT diam:     2.10 cm  LV E/e' lateral: 9.3 LV SV:         65 LV SV Index:   37 LVOT Area:     3.46 cm  RIGHT VENTRICLE RV Basal diam:  2.60 cm RV S prime:     21.40 cm/s TAPSE (M-mode): 2.7 cm LEFT ATRIUM             Index       RIGHT ATRIUM           Index LA diam:        3.60 cm 2.03 cm/m  RA Area:     11.00 cm LA Vol (A2C):   35.0 ml 19.76 ml/m RA Volume:   19.40 ml  10.95 ml/m LA Vol (A4C):   49.2 ml 27.78 ml/m LA Biplane Vol: 44.0 ml 24.84 ml/m  AORTIC VALVE AV Area (Vmax):    2.36 cm AV Area (Vmean):   2.51 cm AV Area (VTI):     3.02 cm AV Vmax:           135.00 cm/s AV Vmean:          92.500 cm/s AV VTI:            0.217 m AV Peak Grad:      7.3 mmHg AV Mean Grad:      4.0 mmHg LVOT Vmax:         92.00  cm/s LVOT Vmean:        67.100 cm/s LVOT VTI:          0.189 m LVOT/AV VTI ratio: 0.87  AORTA Ao Root diam: 3.60 cm Ao Asc diam:  2.70 cm MITRAL VALVE MV Area (PHT): 3.31 cm    SHUNTS MV Area VTI:   2.35 cm    Systemic VTI:  0.19 m MV Peak grad:  3.4 mmHg    Systemic Diam: 2.10 cm MV Mean grad:  1.0 mmHg MV Vmax:       0.93 m/s MV Vmean:      53.4 cm/s MV Decel Time: 229 msec MV E velocity: 58.70 cm/s MV A velocity: 55.70 cm/s MV E/A ratio:  1.05 Marca Ancona MD Electronically signed by Marca Ancona MD Signature Date/Time: 02/02/2021/4:55:07 PM    Final    CT LIVER ABDOMEN W WO CONTRAST  Result Date: 01/31/2021 CLINICAL DATA:  Cirrhosis, evaluate for portal vein thrombosis EXAM: CT ABDOMEN WITHOUT AND WITH CONTRAST TECHNIQUE: Multidetector CT imaging of the abdomen was performed following the standard protocol before and following the bolus administration of intravenous contrast. CONTRAST:  OMNIPAQUE IOHEXOL 300 MG/ML  SOLN COMPARISON:  Right upper quadrant ultrasound dated 01/30/2021 FINDINGS: Lower chest: Lung bases are clear.  Hepatobiliary: Cirrhosis. Mild hepatic steatosis. Heterogeneous early arterial perfusion in the posterior right hepatic lobe, suggesting an early vascular shunt to this region, possibly iatrogenic. No suspicious/enhancing lesions. Gallbladder is unremarkable. No intrahepatic or extrahepatic ductal dilatation. Pancreas: Within normal limits. Spleen: Mildly enlarged, measuring 16.5 cm in maximal craniocaudal dimension. Adrenals/Urinary Tract: Adrenal glands are within normal limits. Kidneys are within normal limits.  No hydronephrosis. Stomach/Bowel: Stomach is within normal limits. Visualized bowel is grossly unremarkable. Vascular/Lymphatic: No evidence of abdominal aortic aneurysm. Patent portal vein, noting mixing artifact in the early arterial phase. Other: Moderate abdominal ascites. Musculoskeletal: Degenerative changes of the visualized thoracolumbar spine. IMPRESSION: Patent portal vein. Cirrhosis with mild hepatic steatosis. Heterogeneous early arterial effusion in the posterior right hepatic lobe suggests an early vascular shunt in this region. No findings suspicious for HCC. Mild splenomegaly.  Moderate abdominal ascites. Electronically Signed   By: Charline Bills M.D.   On: 01/31/2021 09:49   US ABDOMEN LIMITED RUQ (LIVER/GB)  Result Date: 01/30/2021 CLINICAL DATA:  Right upper quadrant abdominal pain EXAM: ULTRASOUND ABDOMEN LIMITED RIGHT UPPER QUADRANT COMPARISON:  12/24/2013 FINDINGS: Gallbladder: No gallstones or wall thickening visualized. No sonographic Murphy sign noted by sonographer. Common bile duct: Diameter: 5 mm. Liver: No focal lesion identified. Coarsened, heterogeneous echotexture with nodular hepatic surface contour. Portal vein appears occluded. There are patent vascular structures seen adjacent to the portal vein within the porta hepatis which may reflect cavernous transformation. Other: Moderate volume ascites. IMPRESSION: 1. Cirrhotic hepatic morphology. No focal liver lesion is  identified. 2. Portal vein appears occluded. Adjacent patent vascular structures within the porta hepatis, possibly reflecting cavernous transformation. Findings can be seen in the setting of chronic portal hypertension. Consider dedicated hepatic protocol CT of the abdomen for further evaluation, as clinically indicated. 3. Unremarkable ultrasound of the gallbladder. Electronically Signed   By: Duanne Guess D.O.   On: 01/30/2021 21:54   IR Paracentesis  Result Date: 01/31/2021 INDICATION: Patient history of alcohol use. Presented to the emergency department with abdominal pain. Found to ascites. Request is for therapeutic and diagnostic paracentesis EXAM: ULTRASOUND GUIDED THERAPEUTIC AND DIAGNOSTIC PARACENTESIS MEDICATIONS: LIDOCAINE 1% 10 ML COMPLICATIONS: None immediate. PROCEDURE: Informed written consent was obtained from the patient  after a discussion of the risks, benefits and alternatives to treatment. A timeout was performed prior to the initiation of the procedure. Initial ultrasound scanning demonstrates a small amount of ascites within the right lower abdominal quadrant. The right lower abdomen was prepped and draped in the usual sterile fashion. 1% lidocaine was used for local anesthesia. Following this, a 19 gauge, 7-cm, Yueh catheter was introduced. An ultrasound image was saved for documentation purposes. The paracentesis was performed. The catheter was removed and a dressing was applied. The patient tolerated the procedure well without immediate post procedural complication. FINDINGS: A total of approximately 2.2 L of straw-colored fluid was removed. Samples were sent to the laboratory as requested by the clinical team. IMPRESSION: Successful ultrasound-guided therapeutic and diagnostic paracentesis yielding 2.2 liters of peritoneal fluid. Read by: Anders GrantJennifer Omohundro, NP Electronically Signed   By: Corlis Leak  Hassell M.D.   On: 01/31/2021 12:58     Discharge Exam: Vitals:   02/04/21 0512  02/04/21 0909  BP: 106/76 100/68  Pulse: 68 69  Resp: 18 18  Temp: 98.3 F (36.8 C) 97.8 F (36.6 C)  SpO2: 98% 98%   Vitals:   02/03/21 2113 02/04/21 0506 02/04/21 0512 02/04/21 0909  BP: 105/74  106/76 100/68  Pulse: 62  68 69  Resp: 16  18 18   Temp: 97.9 F (36.6 C)  98.3 F (36.8 C) 97.8 F (36.6 C)  TempSrc: Oral  Oral Oral  SpO2: 96%  98% 98%  Weight:  59 kg      General: Pt is alert, awake, not in acute distress Cardiovascular: RRR, S1/S2 +, no rubs, no gallops Respiratory: CTA bilaterally, no wheezing, no rhonchi Abdominal: Soft, NT, ND, bowel sounds + Extremities: no edema, no cyanosis    The results of significant diagnostics from this hospitalization (including imaging, microbiology, ancillary and laboratory) are listed below for reference.     Microbiology: Recent Results (from the past 240 hour(s))  Resp Panel by RT-PCR (Flu A&B, Covid) Nasopharyngeal Swab     Status: None   Collection Time: 01/31/21  6:30 AM   Specimen: Nasopharyngeal Swab; Nasopharyngeal(NP) swabs in vial transport medium  Result Value Ref Range Status   SARS Coronavirus 2 by RT PCR NEGATIVE NEGATIVE Final    Comment: (NOTE) SARS-CoV-2 target nucleic acids are NOT DETECTED.  The SARS-CoV-2 RNA is generally detectable in upper respiratory specimens during the acute phase of infection. The lowest concentration of SARS-CoV-2 viral copies this assay can detect is 138 copies/mL. A negative result does not preclude SARS-Cov-2 infection and should not be used as the sole basis for treatment or other patient management decisions. A negative result may occur with  improper specimen collection/handling, submission of specimen other than nasopharyngeal swab, presence of viral mutation(s) within the areas targeted by this assay, and inadequate number of viral copies(<138 copies/mL). A negative result must be combined with clinical observations, patient history, and  epidemiological information. The expected result is Negative.  Fact Sheet for Patients:  BloggerCourse.comhttps://www.fda.gov/media/152166/download  Fact Sheet for Healthcare Providers:  SeriousBroker.ithttps://www.fda.gov/media/152162/download  This test is no t yet approved or cleared by the [place] FDA and  has been authorized for detection and/or diagnosis of SARS-CoV-2 by FDA under an Emergency Use Authorization (EUA). This EUA will remain  in effect (meaning this test can be used) for the duration of the COVID-19 declaration under Section 564(b)(1) of the Act, 21 U.S.C.section 360bbb-3(b)(1), unless the authorization is terminated  or revoked sooner.  Influenza A by PCR NEGATIVE NEGATIVE Final   Influenza B by PCR NEGATIVE NEGATIVE Final    Comment: (NOTE) The Xpert Xpress SARS-CoV-2/FLU/RSV plus assay is intended as an aid in the diagnosis of influenza from Nasopharyngeal swab specimens and should not be used as a sole basis for treatment. Nasal washings and aspirates are unacceptable for Xpert Xpress SARS-CoV-2/FLU/RSV testing.  Fact Sheet for Patients: BloggerCourse.com  Fact Sheet for Healthcare Providers: SeriousBroker.it  This test is not yet approved or cleared by the [place] FDA and has been authorized for detection and/or diagnosis of SARS-CoV-2 by FDA under an Emergency Use Authorization (EUA). This EUA will remain in effect (meaning this test can be used) for the duration of the COVID-19 declaration under Section 564(b)(1) of the Act, 21 U.S.C. section 360bbb-3(b)(1), unless the authorization is terminated or revoked.  Performed at [facility], [address], [place], [place] [address]   Culture, body fluid w Gram Stain-bottle     Status: None (Preliminary result)   Collection Time: 01/31/21 10:26 AM   Specimen: Fluid  Result Value Ref Range Status   Specimen Description FLUID  Final   Special Requests NONE   Final   Culture   Final    NO GROWTH 4 DAYS Performed at [facility], [address], [place], [place] [address]    Report Status PENDING  Incomplete  Gram stain     Status: None   Collection Time: 01/31/21 10:26 AM   Specimen: Fluid  Result Value Ref Range Status   Specimen Description FLUID  Final   Special Requests NONE  Final   Gram Stain   Final    WBC PRESENT,BOTH PMN AND MONONUCLEAR NO ORGANISMS SEEN CYTOSPIN SMEAR Performed at [facility], [address] [place], [place] [address]    Report Status 01/31/2021 FINAL  Final     Labs: BNP (last 3 results) Recent Labs    01/31/21 0804  BNP 163.1*   Basic Metabolic Panel: Recent Labs  Lab 01/30/21 2106 01/31/21 0804 02/01/21 0450 02/01/21 1019 02/02/21 0243 02/03/21 0330 02/03/21 1204  NA 132*  --  136  --  133* 135 132*  K 3.8  --  3.4*  --  3.9 3.8 3.2*  CL 98  --  103  --  101 101 97*  CO2 26  --  28  --  GLUCOSE 89  --  104*  --  107* 114* 138*  BUN <5*  --  5*  --  CREATININE 0.48*  --  0.54*  --  0.63 0.55* 0.71  CALCIUM 7.8*  --  7.9*  --  8.1* 8.3* 8.3*  MG  --  1.8  --  1.9  --   --  1.7  PHOS  --   --   --   --   --   --  4.3   Liver Function Tests: Recent Labs  Lab 01/30/21 2106 02/01/21 0450 02/02/21 0243 02/03/21 0330 02/03/21 1204  AST 125* 83* 84* 71* 81*  ALT ALKPHOS 241* 200* 200* 184* 199*  BILITOT 3.7* 3.7* 2.8* 2.9* 3.4*  PROT 8.3* 7.4 7.5 6.9 7.9  ALBUMIN 2.0* 1.7* 1.8* 1.7* 2.0*   Recent Labs  Lab 01/31/21 0804  LIPASE 39   Recent Labs  Lab 01/30/21 2106  AMMONIA 75*   CBC: Recent Labs  Lab 02/01/21 0450 02/02/21 0243  02/03/21 0330 02/03/21 1204 02/04/21 0455  WBC 6.7 6.6 6.3 7.5 6.0  NEUTROABS  --  4.8 4.5  --  4.0  HGB 10.9*  10.6* 11.3* 11.6* 12.7* 11.5*  HCT 32.2*  32.4* 34.5* 34.2* 38.4* 34.1*  MCV 94.4 95.0 94.0 94.6 94.5  PLT PLATELET CLUMPS NOTED ON SMEAR, UNABLE TO ESTIMATE PLATELET CLUMPS NOTED ON  SMEAR, UNABLE TO ESTIMATE 74* 109* 92*   Cardiac Enzymes: No results for input(s): CKTOTAL, CKMB, CKMBINDEX, TROPONINI in the last 168 hours. BNP: Invalid input(s): POCBNP CBG: No results for input(s): GLUCAP in the last 168 hours. D-Dimer No results for input(s): DDIMER in the last 72 hours. Hgb A1c No results for input(s): HGBA1C in the last 72 hours. Lipid Profile No results for input(s): CHOL, HDL, LDLCALC, TRIG, CHOLHDL, LDLDIRECT in the last 72 hours. Thyroid function studies No results for input(s): TSH, T4TOTAL, T3FREE, THYROIDAB in the last 72 hours.  Invalid input(s): FREET3 Anemia work up Recent Labs    02/03/21 1204  FERRITIN 84  TIBC 206*  IRON 54   Urinalysis No results found for: COLORURINE, APPEARANCEUR, LABSPEC, PHURINE, GLUCOSEU, HGBUR, BILIRUBINUR, KETONESUR, PROTEINUR, UROBILINOGEN, NITRITE, LEUKOCYTESUR Sepsis Labs Invalid input(s): PROCALCITONIN,  WBC,  LACTICIDVEN Microbiology Recent Results (from the past 240 hour(s))  Resp Panel by RT-PCR (Flu A&B, Covid) Nasopharyngeal Swab     Status: None   Collection Time: 01/31/21  6:30 AM   Specimen: Nasopharyngeal Swab; Nasopharyngeal(NP) swabs in vial transport medium  Result Value Ref Range Status   SARS Coronavirus 2 by RT PCR NEGATIVE NEGATIVE Final    Comment: (NOTE) SARS-CoV-2 target nucleic acids are NOT DETECTED.  The SARS-CoV-2 RNA is generally detectable in upper respiratory specimens during the acute phase of infection. The lowest concentration of SARS-CoV-2 viral copies this assay can detect is 138 copies/mL. A negative result does not preclude SARS-Cov-2 infection and should not be used as the sole basis for treatment or other patient management decisions. A negative result may occur with  improper specimen collection/handling, submission of specimen other than nasopharyngeal swab, presence of viral mutation(s) within the areas targeted by this assay, and inadequate number of  viral copies(<138 copies/mL). A negative result must be combined with clinical observations, patient history, and epidemiological information. The expected result is Negative.  Fact Sheet for Patients:  BloggerCourse.com  Fact Sheet for Healthcare Providers:  SeriousBroker.it  This test is no t yet approved or cleared by the [place] FDA and  has been authorized for detection and/or diagnosis of SARS-CoV-2 by FDA under an Emergency Use Authorization (EUA). This EUA will remain  in effect (meaning this test can be used) for the duration of the COVID-19 declaration under Section 564(b)(1) of the Act, 21 U.S.C.section 360bbb-3(b)(1), unless the authorization is terminated  or revoked sooner.       Influenza A by PCR NEGATIVE NEGATIVE Final   Influenza B by PCR NEGATIVE NEGATIVE Final    Comment: (NOTE) The Xpert Xpress SARS-CoV-2/FLU/RSV plus assay is intended as an aid in the diagnosis of influenza from Nasopharyngeal swab specimens and should not be used as a sole basis for treatment. Nasal washings and aspirates are unacceptable for Xpert Xpress SARS-CoV-2/FLU/RSV testing.  Fact Sheet for Patients: BloggerCourse.com  Fact Sheet for Healthcare Providers: SeriousBroker.it  This test is not yet approved or cleared by the [place] FDA and has been authorized for detection and/or diagnosis of SARS-CoV-2 by FDA under an Emergency Use Authorization (EUA). This EUA will remain in effect (meaning  this test can be used) for the duration of the COVID-19 declaration under Section 564(b)(1) of the Act, 21 U.S.C. section 360bbb-3(b)(1), unless the authorization is terminated or revoked.  Performed at [facility], [address] [place], [place] [address]   Culture, body fluid w Gram Stain-bottle     Status: None (Preliminary result)   Collection Time: 01/31/21 10:26 AM    Specimen: Fluid  Result Value Ref Range Status   Specimen Description FLUID  Final   Special Requests NONE  Final   Culture   Final    NO GROWTH 4 DAYS Performed at [facility], [address], [place], [place] [address]    Report Status PENDING  Incomplete  Gram stain     Status: None   Collection Time: 01/31/21 10:26 AM   Specimen: Fluid  Result Value Ref Range Status   Specimen Description FLUID  Final   Special Requests NONE  Final   Gram Stain   Final    WBC PRESENT,BOTH PMN AND MONONUCLEAR NO ORGANISMS SEEN CYTOSPIN SMEAR Performed at [facility], [address] [place], [place] [address]    Report Status 01/31/2021 FINAL  Final     Time coordinating discharge: Over 30 minutes  SIGNED:   Hughie Closs, MD  [facility] 02/04/2021, 11:41 AM  If 7PM-7AM, please contact night-coverage [email or web address]

## 2021-02-04 NOTE — Plan of Care (Signed)
  Problem: Activity: Goal: Risk for activity intolerance will decrease Outcome: Progressing   Problem: Nutrition: Goal: Adequate nutrition will be maintained Outcome: Progressing   Problem: Coping: Goal: Level of anxiety will decrease Outcome: Progressing   

## 2021-02-04 NOTE — Progress Notes (Signed)
DISCHARGE NOTE HOME Donald Briggs to be discharged Home per MD order. Discussed prescriptions and follow up appointments with the patient. Prescriptions given to patient; medication list explained in detail. Patient verbalized understanding.  Skin clean, dry and intact without evidence of skin break down, no evidence of skin tears noted. IV catheter discontinued intact. Site without signs and symptoms of complications. Dressing and pressure applied. Pt denies pain at the site currently. No complaints noted.  Patient free of lines, drains, and wounds.   An After Visit Summary (AVS) was printed and given to the patient. Patient escorted via wheelchair, and discharged home via private auto.  Katrina Stack, RN

## 2021-02-04 NOTE — Plan of Care (Signed)
  Problem: Health Behavior/Discharge Planning: Goal: Ability to manage health-related needs will improve Outcome: Progressing   Problem: Activity: Goal: Risk for activity intolerance will decrease Outcome: Progressing   Problem: Safety: Goal: Ability to remain free from injury will improve Outcome: Progressing   

## 2021-02-04 NOTE — Plan of Care (Signed)
  Problem: Education: Goal: Knowledge of General Education information will improve Description: Including pain rating scale, medication(s)/side effects and non-pharmacologic comfort measures Outcome: Adequate for Discharge   Problem: Health Behavior/Discharge Planning: Goal: Ability to manage health-related needs will improve Outcome: Adequate for Discharge   Problem: Clinical Measurements: Goal: Ability to maintain clinical measurements within normal limits will improve Outcome: Adequate for Discharge Goal: Will remain free from infection Outcome: Adequate for Discharge Goal: Diagnostic test results will improve Outcome: Adequate for Discharge Goal: Respiratory complications will improve Outcome: Adequate for Discharge Goal: Cardiovascular complication will be avoided Outcome: Adequate for Discharge   Problem: Activity: Goal: Risk for activity intolerance will decrease 02/04/2021 1110 by Katrina Stack, RN Outcome: Adequate for Discharge 02/04/2021 0758 by Katrina Stack, RN Outcome: Progressing   Problem: Nutrition: Goal: Adequate nutrition will be maintained 02/04/2021 1110 by Katrina Stack, RN Outcome: Adequate for Discharge 02/04/2021 0758 by Katrina Stack, RN Outcome: Progressing   Problem: Coping: Goal: Level of anxiety will decrease 02/04/2021 1110 by Katrina Stack, RN Outcome: Adequate for Discharge 02/04/2021 0758 by Katrina Stack, RN Outcome: Progressing   Problem: Elimination: Goal: Will not experience complications related to bowel motility Outcome: Adequate for Discharge Goal: Will not experience complications related to urinary retention Outcome: Adequate for Discharge   Problem: Pain Managment: Goal: General experience of comfort will improve Outcome: Adequate for Discharge   Problem: Safety: Goal: Ability to remain free from injury will improve Outcome: Adequate for Discharge   Problem: Skin Integrity: Goal: Risk for impaired  skin integrity will decrease Outcome: Adequate for Discharge

## 2021-02-05 LAB — CULTURE, BODY FLUID W GRAM STAIN -BOTTLE: Culture: NO GROWTH

## 2021-02-06 ENCOUNTER — Telehealth: Payer: Self-pay

## 2021-02-06 NOTE — Telephone Encounter (Signed)
Transition Care Management Follow-up Telephone Call  Call placed with assistance of Spanish Interpreter # [id number]/[employer].  Patient also put her niece, Toniann Fail, on the phone to assist with the call.  Date of discharge and from where: 02/04/2021, [facility] How have you been since you were released from the hospital? He said he is feeling good.   Any questions or concerns? Yes - he said that he feels dizzy shortly after taking his medications but the dizziness resolves quickly. No other concerns reported.   Items Reviewed: Did the pt receive and understand the discharge instructions provided? Yes  Medications obtained and verified? Yes  - he said he has all 3 medications and did not have any questions about the med regime. Other? No  Any new allergies since your discharge? No  Do you have support at home? Yes, his family  Home Care and Equipment/Supplies: Were home health services ordered? no If so, what is the name of the agency? N/a  Has the agency set up a time to come to the patient's home? not applicable Were any new equipment or medical supplies ordered?  No What is the name of the medical supply agency? N/a Were you able to get the supplies/equipment? not applicable Do you have any questions related to the use of the equipment or supplies? No  Functional Questionnaire: (I = Independent and D = Dependent) ADLs: independent   Follow up appointments reviewed:  PCP Hospital f/u appt confirmed? Yes  Scheduled to see Angus Seller, NP  on 02/13/2021 @ 1500 @ [facility]. Will then need to re-establish care with PCP.  Specialist Hospital f/u appt confirmed? Yes  Scheduled to see GI - 03/08/2021 Are transportation arrangements needed? No  If their condition worsens, is the pt aware to call PCP or go to the Emergency Dept.? Yes Was the patient provided with contact information for the PCP's office or ED? Yes - he was provided with the phone number and address for [facility] Was to pt  encouraged to call back with questions or concerns? Yes

## 2021-02-10 LAB — GLIA (IGA/G) + TTG IGA
Antigliadin Abs, IgA: 10 units (ref 0–19)
Gliadin IgG: 18 units (ref 0–19)
Tissue Transglutaminase Ab, IgA: 4 U/mL — ABNORMAL HIGH (ref 0–3)

## 2021-02-13 ENCOUNTER — Ambulatory Visit (INDEPENDENT_AMBULATORY_CARE_PROVIDER_SITE_OTHER): Payer: Self-pay | Admitting: Nurse Practitioner

## 2021-02-13 ENCOUNTER — Other Ambulatory Visit: Payer: Self-pay | Admitting: Nurse Practitioner

## 2021-02-13 VITALS — BP 107/68 | HR 69 | Temp 97.9°F | Resp 18

## 2021-02-13 DIAGNOSIS — I5033 Acute on chronic diastolic (congestive) heart failure: Secondary | ICD-10-CM

## 2021-02-13 DIAGNOSIS — K7031 Alcoholic cirrhosis of liver with ascites: Secondary | ICD-10-CM

## 2021-02-13 NOTE — Patient Instructions (Addendum)
Alcoholic Cirrhosis:  Abstain from alcohol  Stay active Stay well hydrated  Please keep follow up with GI  Acute on Chronic Diastolic Heart Failure:  Will place referral for cardiology    Follow up with Pcp

## 2021-02-13 NOTE — Progress Notes (Signed)
@Patient  ID: , male    DOB: [birth year], 45 y.o.   MRN: [id number]  Chief Complaint  Patient presents with   Hospitalization Follow-up     Referring provider: [id number], NP   HPI  Patient presents today for hospital follow-up/transition of care visit.  Patient was admitted to the hospital on 01/30/2021 and discharged on 02/04/2021.  Patient was admitted with abdominal pain.  Patient does have a history of alcohol abuse and is a smoker.  CT scan revealed cirrhosis with mild hepatic steatosis.  It also showed heterogeneous early arterial effusion in the posterior right hepatic lobe and mild spleno megaly with moderate abdominal ascites.  EGD showed grade 1 esophageal varices.  Patient does have follow-up scheduled with GI.  Patient states that overall he has been doing well since hospital discharge.  It was also noted that the patient was found to have acute on chronic diastolic congestive heart failure.  We will place a referral for cardiology for follow-up on this. Denies f/c/s, n/v/d, hemoptysis, PND, chest pain or edema.       No Known Allergies  Immunization History  Administered Date(s) Administered   Influenza,inj,Quad PF,6+ Mos 10/07/2017   Tdap 11/11/2018    Past Medical History:  Diagnosis Date   Alcohol abuse    Back pain    Bell's palsy    Tobacco use     Tobacco History: Social History   Tobacco Use  Smoking Status Every Day   Pack years: 0.00  Smokeless Tobacco Never  Tobacco Comments   2 cigarettes per day    Ready to quit: Not Answered Counseling given: Not Answered Tobacco comments: 2 cigarettes per day    Outpatient Encounter Medications as of 02/13/2021  Medication Sig   furosemide (LASIX) 40 MG tablet Take 1 tablet (40 mg total) by mouth daily.   omeprazole (PRILOSEC) 40 MG capsule Take 1 capsule (40 mg total) by mouth daily.   spironolactone (ALDACTONE) 50 MG tablet Take 1 tablet (50 mg total) by mouth daily.   No  facility-administered encounter medications on file as of 02/13/2021.     Review of Systems  Review of Systems  Constitutional: Negative.   HENT: Negative.    Respiratory:  Negative for cough and shortness of breath.   Cardiovascular: Negative.   Gastrointestinal: Negative.  Negative for abdominal distention, abdominal pain and blood in stool.  Allergic/Immunologic: Negative.   Neurological: Negative.   Psychiatric/Behavioral: Negative.        Physical Exam  BP 107/68   Pulse 69   Temp 97.9 F (36.6 C)   Resp 18   SpO2 98%   Wt Readings from Last 5 Encounters:  02/04/21 130 lb 1.1 oz (59 kg)  11/11/18 154 lb 3.2 oz (69.9 kg)  09/30/18 155 lb 6.4 oz (70.5 kg)  10/07/17 133 lb 12.8 oz (60.7 kg)  09/02/17 135 lb (61.2 kg)     Physical Exam Vitals and nursing note reviewed.  Constitutional:      General: He is not in acute distress.    Appearance: He is well-developed.  Cardiovascular:     Rate and Rhythm: Normal rate and regular rhythm.  Pulmonary:     Effort: Pulmonary effort is normal.     Breath sounds: Normal breath sounds.  Abdominal:     General: There is no distension.     Tenderness: There is no abdominal tenderness.  Skin:    General: Skin is warm and dry.  Neurological:  Mental Status: He is alert and oriented to person, place, and time.     Lab Results:  CBC    Component Value Date/Time   WBC 9.6 02/13/2021 1622   WBC 6.0 02/04/2021 0455   RBC 3.86 (L) 02/13/2021 1622   RBC 3.61 (L) 02/04/2021 0455   HGB 12.1 (L) 02/13/2021 1622   HCT 33.0 (L) 02/13/2021 1622   PLT 83 (LL) 02/13/2021 1622   MCV 86 02/13/2021 1622   MCH 31.3 02/13/2021 1622   MCH 31.9 02/04/2021 0455   MCHC 36.7 (H) 02/13/2021 1622   MCHC 33.7 02/04/2021 0455   RDW 14.3 02/13/2021 1622   LYMPHSABS 1.0 02/04/2021 0455   MONOABS 0.7 02/04/2021 0455   EOSABS 0.3 02/04/2021 0455   BASOSABS 0.1 02/04/2021 0455    BMET    Component Value Date/Time   NA 132 (L)  02/13/2021 1622   K 3.9 02/13/2021 1622   CL 96 02/13/2021 1622   CO2 25 02/13/2021 1622   GLUCOSE 189 (H) 02/13/2021 1622   GLUCOSE 138 (H) 02/03/2021 1204   BUN 10 02/13/2021 1622   CREATININE 0.78 02/13/2021 1622   CALCIUM 8.4 (L) 02/13/2021 1622   GFRNONAA >60 02/03/2021 1204   GFRAA 130 11/11/2018 1205    BNP    Component Value Date/Time   BNP 163.1 (H) 01/31/2021 0804    ProBNP No results found for: PROBNP  Imaging: DG Chest 2 View  Result Date: 01/30/2021 CLINICAL DATA:  Right upper quadrant pain and diarrhea. EXAM: CHEST - 2 VIEW COMPARISON:  December 24, 2013 FINDINGS: Mild, diffusely increased interstitial lung markings are seen. There is no evidence of pleural effusion or pneumothorax. Prominence of the bilateral perihilar pulmonary vasculature is seen. The heart size and mediastinal contours are within normal limits. Chronic sclerotic changes are seen along the eighth right rib. IMPRESSION: Pulmonary vascular congestion with mild interstitial edema. Electronically Signed   By: Aram Candela M.D.   On: 01/30/2021 21:18   ECHOCARDIOGRAM COMPLETE  Result Date: 02/02/2021    ECHOCARDIOGRAM REPORT   Patient Name:   CAVION FAIOLA Date of Exam: 02/02/2021 Medical Rec #:  [id number]      Height:       65.0 in Accession #:    [id number]     Weight:       154.2 lb Date of Birth:  [birth year]      BSA:          1.771 m Patient Age:    45 years       BP:           145/95 mmHg Patient Gender: M              HR:           67 bpm. Exam Location:  Inpatient Procedure: Cardiac Doppler, 2D Echo and Color Doppler Indications:    CHF-Acute Diastolic  History:        Patient has no prior history of Echocardiogram examinations.                 Risk Factors:Current Smoker. ETOH abuse.  Sonographer:    Ross Ludwig RDCS (AE) Referring Phys: [id number] [facility]  Sonographer Comments: Suboptimal subcostal window. IMPRESSIONS  1. Left ventricular ejection fraction, by estimation, is 55 to 60%. The left  ventricle has normal function. The left ventricle has no regional wall motion abnormalities. Left ventricular diastolic parameters are consistent with Grade I diastolic dysfunction (impaired relaxation).  2. Right  ventricular systolic function is normal. The right ventricular size is normal. Tricuspid regurgitation signal is inadequate for assessing PA pressure.  3. The mitral valve is degenerative. No evidence of mitral valve regurgitation. No evidence of mitral stenosis.  4. The aortic valve is tricuspid. Aortic valve regurgitation is not visualized. Mild aortic valve sclerosis is present, with no evidence of aortic valve stenosis.  5. The inferior vena cava is normal in size with greater than 50% respiratory variability, suggesting right atrial pressure of 3 mmHg. FINDINGS  Left Ventricle: Left ventricular ejection fraction, by estimation, is 55 to 60%. The left ventricle has normal function. The left ventricle has no regional wall motion abnormalities. The left ventricular internal cavity size was normal in size. There is  no left ventricular hypertrophy. Left ventricular diastolic parameters are consistent with Grade I diastolic dysfunction (impaired relaxation). Right Ventricle: The right ventricular size is normal. No increase in right ventricular wall thickness. Right ventricular systolic function is normal. Tricuspid regurgitation signal is inadequate for assessing PA pressure. Left Atrium: Left atrial size was normal in size. Right Atrium: Right atrial size was normal in size. Pericardium: There is no evidence of pericardial effusion. Mitral Valve: The mitral valve is degenerative in appearance. There is mild calcification of the mitral valve leaflet(s). Mild mitral annular calcification. No evidence of mitral valve regurgitation. No evidence of mitral valve stenosis. MV peak gradient, 3.4 mmHg. The mean mitral valve gradient is 1.0 mmHg. Tricuspid Valve: The tricuspid valve is normal in structure.  Tricuspid valve regurgitation is not demonstrated. Aortic Valve: The aortic valve is tricuspid. Aortic valve regurgitation is not visualized. Mild aortic valve sclerosis is present, with no evidence of aortic valve stenosis. Aortic valve mean gradient measures 4.0 mmHg. Aortic valve peak gradient measures 7.3 mmHg. Aortic valve area, by VTI measures 3.02 cm. Pulmonic Valve: The pulmonic valve was normal in structure. Pulmonic valve regurgitation is not visualized. Aorta: The aortic root is normal in size and structure. Venous: The inferior vena cava is normal in size with greater than 50% respiratory variability, suggesting right atrial pressure of 3 mmHg. IAS/Shunts: No atrial level shunt detected by color flow Doppler.  LEFT VENTRICLE PLAX 2D LVIDd:         5.20 cm  Diastology LVIDs:         2.70 cm  LV e' medial:    6.31 cm/s LV PW:         1.10 cm  LV E/e' medial:  9.3 LV IVS:        0.90 cm  LV e' lateral:   6.31 cm/s LVOT diam:     2.10 cm  LV E/e' lateral: 9.3 LV SV:         65 LV SV Index:   37 LVOT Area:     3.46 cm  RIGHT VENTRICLE RV Basal diam:  2.60 cm RV S prime:     21.40 cm/s TAPSE (M-mode): 2.7 cm LEFT ATRIUM             Index       RIGHT ATRIUM           Index LA diam:        3.60 cm 2.03 cm/m  RA Area:     11.00 cm LA Vol (A2C):   35.0 ml 19.76 ml/m RA Volume:   19.40 ml  10.95 ml/m LA Vol (A4C):   49.2 ml 27.78 ml/m LA Biplane Vol: 44.0 ml 24.84 ml/m  AORTIC VALVE AV Area (  Vmax):    2.36 cm AV Area (Vmean):   2.51 cm AV Area (VTI):     3.02 cm AV Vmax:           135.00 cm/s AV Vmean:          92.500 cm/s AV VTI:            0.217 m AV Peak Grad:      7.3 mmHg AV Mean Grad:      4.0 mmHg LVOT Vmax:         92.00 cm/s LVOT Vmean:        67.100 cm/s LVOT VTI:          0.189 m LVOT/AV VTI ratio: 0.87  AORTA Ao Root diam: 3.60 cm Ao Asc diam:  2.70 cm MITRAL VALVE MV Area (PHT): 3.31 cm    SHUNTS MV Area VTI:   2.35 cm    Systemic VTI:  0.19 m MV Peak grad:  3.4 mmHg    Systemic Diam: 2.10  cm MV Mean grad:  1.0 mmHg MV Vmax:       0.93 m/s MV Vmean:      53.4 cm/s MV Decel Time: 229 msec MV E velocity: 58.70 cm/s MV A velocity: 55.70 cm/s MV E/A ratio:  1.05 Marca Ancona MD Electronically signed by Marca Ancona MD Signature Date/Time: 02/02/2021/4:55:07 PM    Final    CT LIVER ABDOMEN W WO CONTRAST  Result Date: 01/31/2021 CLINICAL DATA:  Cirrhosis, evaluate for portal vein thrombosis EXAM: CT ABDOMEN WITHOUT AND WITH CONTRAST TECHNIQUE: Multidetector CT imaging of the abdomen was performed following the standard protocol before and following the bolus administration of intravenous contrast. CONTRAST:  OMNIPAQUE IOHEXOL 300 MG/ML  SOLN COMPARISON:  Right upper quadrant ultrasound dated 01/30/2021 FINDINGS: Lower chest: Lung bases are clear. Hepatobiliary: Cirrhosis. Mild hepatic steatosis. Heterogeneous early arterial perfusion in the posterior right hepatic lobe, suggesting an early vascular shunt to this region, possibly iatrogenic. No suspicious/enhancing lesions. Gallbladder is unremarkable. No intrahepatic or extrahepatic ductal dilatation. Pancreas: Within normal limits. Spleen: Mildly enlarged, measuring 16.5 cm in maximal craniocaudal dimension. Adrenals/Urinary Tract: Adrenal glands are within normal limits. Kidneys are within normal limits.  No hydronephrosis. Stomach/Bowel: Stomach is within normal limits. Visualized bowel is grossly unremarkable. Vascular/Lymphatic: No evidence of abdominal aortic aneurysm. Patent portal vein, noting mixing artifact in the early arterial phase. Other: Moderate abdominal ascites. Musculoskeletal: Degenerative changes of the visualized thoracolumbar spine. IMPRESSION: Patent portal vein. Cirrhosis with mild hepatic steatosis. Heterogeneous early arterial effusion in the posterior right hepatic lobe suggests an early vascular shunt in this region. No findings suspicious for HCC. Mild splenomegaly.  Moderate abdominal ascites. Electronically Signed    By: Charline Bills M.D.   On: 01/31/2021 09:49   US ABDOMEN LIMITED RUQ (LIVER/GB)  Result Date: 01/30/2021 CLINICAL DATA:  Right upper quadrant abdominal pain EXAM: ULTRASOUND ABDOMEN LIMITED RIGHT UPPER QUADRANT COMPARISON:  12/24/2013 FINDINGS: Gallbladder: No gallstones or wall thickening visualized. No sonographic Murphy sign noted by sonographer. Common bile duct: Diameter: 5 mm. Liver: No focal lesion identified. Coarsened, heterogeneous echotexture with nodular hepatic surface contour. Portal vein appears occluded. There are patent vascular structures seen adjacent to the portal vein within the porta hepatis which may reflect cavernous transformation. Other: Moderate volume ascites. IMPRESSION: 1. Cirrhotic hepatic morphology. No focal liver lesion is identified. 2. Portal vein appears occluded. Adjacent patent vascular structures within the porta hepatis, possibly reflecting cavernous transformation. Findings can be seen in the  setting of chronic portal hypertension. Consider dedicated hepatic protocol CT of the abdomen for further evaluation, as clinically indicated. 3. Unremarkable ultrasound of the gallbladder. Electronically Signed   By: Duanne GuessNicholas  Plundo D.O.   On: 01/30/2021 21:54   IR Paracentesis  Result Date: 01/31/2021 INDICATION: Patient history of alcohol use. Presented to the emergency department with abdominal pain. Found to ascites. Request is for therapeutic and diagnostic paracentesis EXAM: ULTRASOUND GUIDED THERAPEUTIC AND DIAGNOSTIC PARACENTESIS MEDICATIONS: LIDOCAINE 1% 10 ML COMPLICATIONS: None immediate. PROCEDURE: Informed written consent was obtained from the patient after a discussion of the risks, benefits and alternatives to treatment. A timeout was performed prior to the initiation of the procedure. Initial ultrasound scanning demonstrates a small amount of ascites within the right lower abdominal quadrant. The right lower abdomen was prepped and draped in the usual  sterile fashion. 1% lidocaine was used for local anesthesia. Following this, a 19 gauge, 7-cm, Yueh catheter was introduced. An ultrasound image was saved for documentation purposes. The paracentesis was performed. The catheter was removed and a dressing was applied. The patient tolerated the procedure well without immediate post procedural complication. FINDINGS: A total of approximately 2.2 L of straw-colored fluid was removed. Samples were sent to the laboratory as requested by the clinical team. IMPRESSION: Successful ultrasound-guided therapeutic and diagnostic paracentesis yielding 2.2 liters of peritoneal fluid. Read by: Anders GrantJennifer Omohundro, NP Electronically Signed   By: Corlis Leak  Hassell M.D.   On: 01/31/2021 12:58     Assessment & Plan:   Alcoholic cirrhosis of liver with ascites (HCC) Abstain from alcohol  Stay active Stay well hydrated  Please keep follow up with GI  Acute on Chronic Diastolic Heart Failure:  Will place referral for cardiology    Follow up with Pcp     Ivonne Andrewonya S Nathan Stallworth, NP 02/23/2021

## 2021-02-14 LAB — COMPREHENSIVE METABOLIC PANEL
ALT: 28 IU/L (ref 0–44)
AST: 73 IU/L — ABNORMAL HIGH (ref 0–40)
Albumin/Globulin Ratio: 0.4 — ABNORMAL LOW (ref 1.2–2.2)
Albumin: 2.3 g/dL — ABNORMAL LOW (ref 4.0–5.0)
Alkaline Phosphatase: 310 IU/L — ABNORMAL HIGH (ref 44–121)
BUN/Creatinine Ratio: 13 (ref 9–20)
BUN: 10 mg/dL (ref 6–24)
Bilirubin Total: 3 mg/dL — ABNORMAL HIGH (ref 0.0–1.2)
CO2: 25 mmol/L (ref 20–29)
Calcium: 8.4 mg/dL — ABNORMAL LOW (ref 8.7–10.2)
Chloride: 96 mmol/L (ref 96–106)
Creatinine, Ser: 0.78 mg/dL (ref 0.76–1.27)
Globulin, Total: 5.5 g/dL — ABNORMAL HIGH (ref 1.5–4.5)
Glucose: 189 mg/dL — ABNORMAL HIGH (ref 65–99)
Potassium: 3.9 mmol/L (ref 3.5–5.2)
Sodium: 132 mmol/L — ABNORMAL LOW (ref 134–144)
Total Protein: 7.8 g/dL (ref 6.0–8.5)
eGFR: 111 mL/min/{1.73_m2} (ref >59)

## 2021-02-14 LAB — CBC
Hematocrit: 33 % — ABNORMAL LOW (ref 37.5–51.0)
Hemoglobin: 12.1 g/dL — ABNORMAL LOW (ref 13.0–17.7)
MCH: 31.3 pg (ref 26.6–33.0)
MCHC: 36.7 g/dL — ABNORMAL HIGH (ref 31.5–35.7)
MCV: 86 fL (ref 79–97)
Platelets: 83 10*3/uL — CL (ref 150–450)
RBC: 3.86 x10E6/uL — ABNORMAL LOW (ref 4.14–5.80)
RDW: 14.3 % (ref 11.6–15.4)
WBC: 9.6 10*3/uL (ref 3.4–10.8)

## 2021-02-23 DIAGNOSIS — I5033 Acute on chronic diastolic (congestive) heart failure: Secondary | ICD-10-CM | POA: Insufficient documentation

## 2021-02-23 NOTE — Assessment & Plan Note (Signed)
Abstain from alcohol  Stay active Stay well hydrated  Please keep follow up with GI  Acute on Chronic Diastolic Heart Failure:  Will place referral for cardiology    Follow up with Pcp

## 2021-03-08 ENCOUNTER — Telehealth: Payer: Self-pay | Admitting: Nurse Practitioner

## 2021-03-08 ENCOUNTER — Ambulatory Visit (INDEPENDENT_AMBULATORY_CARE_PROVIDER_SITE_OTHER): Payer: Self-pay | Admitting: Nurse Practitioner

## 2021-03-08 ENCOUNTER — Other Ambulatory Visit (INDEPENDENT_AMBULATORY_CARE_PROVIDER_SITE_OTHER): Payer: Self-pay

## 2021-03-08 ENCOUNTER — Encounter: Payer: Self-pay | Admitting: Nurse Practitioner

## 2021-03-08 ENCOUNTER — Other Ambulatory Visit (HOSPITAL_COMMUNITY): Payer: Self-pay

## 2021-03-08 VITALS — BP 90/56 | HR 91 | Ht 64.0 in | Wt 132.0 lb

## 2021-03-08 DIAGNOSIS — Z23 Encounter for immunization: Secondary | ICD-10-CM

## 2021-03-08 DIAGNOSIS — K7030 Alcoholic cirrhosis of liver without ascites: Secondary | ICD-10-CM

## 2021-03-08 LAB — COMPREHENSIVE METABOLIC PANEL
ALT: 32 U/L (ref 0–53)
AST: 58 U/L — ABNORMAL HIGH (ref 0–37)
Albumin: 2.6 g/dL — ABNORMAL LOW (ref 3.5–5.2)
Alkaline Phosphatase: 242 U/L — ABNORMAL HIGH (ref 39–117)
BUN: 11 mg/dL (ref 6–23)
CO2: 27 mEq/L (ref 19–32)
Calcium: 8.5 mg/dL (ref 8.4–10.5)
Chloride: 104 mEq/L (ref 96–112)
Creatinine, Ser: 0.57 mg/dL (ref 0.40–1.50)
GFR: 117.65 mL/min (ref >60.00)
Glucose, Bld: 102 mg/dL — ABNORMAL HIGH (ref 70–99)
Potassium: 3.6 mEq/L (ref 3.5–5.1)
Sodium: 136 mEq/L (ref 135–145)
Total Bilirubin: 2.5 mg/dL — ABNORMAL HIGH (ref 0.2–1.2)
Total Protein: 7.8 g/dL (ref 6.0–8.3)

## 2021-03-08 LAB — CBC
HCT: 35.4 % — ABNORMAL LOW (ref 39.0–52.0)
Hemoglobin: 12.3 g/dL — ABNORMAL LOW (ref 13.0–17.0)
MCHC: 34.6 g/dL (ref 30.0–36.0)
MCV: 90.8 fl (ref 78.0–100.0)
Platelets: 83 10*3/uL — ABNORMAL LOW (ref 150.0–400.0)
RBC: 3.9 Mil/uL — ABNORMAL LOW (ref 4.22–5.81)
RDW: 17.2 % — ABNORMAL HIGH (ref 11.5–15.5)
WBC: 5.8 10*3/uL (ref 4.0–10.5)

## 2021-03-08 LAB — PROTIME-INR
INR: 1.4 ratio — ABNORMAL HIGH (ref 0.8–1.0)
Prothrombin Time: 15.6 s — ABNORMAL HIGH (ref 9.6–13.1)

## 2021-03-08 MED ORDER — OMEPRAZOLE 40 MG PO CPDR
40.0000 mg | DELAYED_RELEASE_CAPSULE | Freq: Every day | ORAL | 3 refills | Status: DC
  Filled 2021-03-08: qty 30, 30d supply, fill #0

## 2021-03-08 MED ORDER — SPIRONOLACTONE 50 MG PO TABS
50.0000 mg | ORAL_TABLET | Freq: Every day | ORAL | 3 refills | Status: DC
Start: 2021-03-08 — End: 2021-03-08
  Filled 2021-03-08: qty 30, 30d supply, fill #0

## 2021-03-08 MED ORDER — LACTULOSE ENCEPHALOPATHY 10 GM/15ML PO SOLN
20.0000 g | Freq: Two times a day (BID) | ORAL | 2 refills | Status: DC
  Filled 2021-03-08: qty 450, 8d supply, fill #0

## 2021-03-08 MED ORDER — FUROSEMIDE 40 MG PO TABS: 40.0000 mg | ORAL_TABLET | Freq: Every day | ORAL | 3 refills | Status: AC

## 2021-03-08 MED ORDER — FUROSEMIDE 40 MG PO TABS
40.0000 mg | ORAL_TABLET | Freq: Every day | ORAL | 3 refills | Status: DC
  Filled 2021-03-08: qty 30, 30d supply, fill #0

## 2021-03-08 MED ORDER — OMEPRAZOLE 40 MG PO CPDR: 40.0000 mg | DELAYED_RELEASE_CAPSULE | Freq: Every day | ORAL | 3 refills | Status: AC

## 2021-03-08 MED ORDER — LACTULOSE ENCEPHALOPATHY 10 GM/15ML PO SOLN: 20.0000 g | Freq: Two times a day (BID) | ORAL | 2 refills | Status: AC

## 2021-03-08 MED ORDER — SPIRONOLACTONE 50 MG PO TABS: 50.0000 mg | ORAL_TABLET | Freq: Every day | ORAL | 3 refills | Status: AC

## 2021-03-08 NOTE — Progress Notes (Signed)
ASSESSMENT AND PLAN    #  45 yo Hispanic male ( here with an interpreter) with recently diagnosed decompensated cirrhosis after presenting with ascites, severe portal hypertensive gastropathy, grade 1 esophageal varices, and coagulopathy. Today he has mild asterixis suggesting hepatic encephalopathy. Portal vein occlusion on RUQ  but CT scan shows that it is patent . Cirrhosis likely secondary to Etoh.    --MELD 32 based on 6/20 labs.  --HCC screening:  Normal AFP  June 2022, CT w/ contrast without focal liver lesions (June 2022).  --Varices screening: EGD June 2022 showed severe portal hypertension with oozing and grade I esophageal varices --Asterixis on exam today but mentation is normal ( grade I HE ?). Start lactulose 20 grams. Take 1 to 3 times daiy ( goal is 2-3 BMs daily).  --follow up labs today to see if improving off etoh --2 gram NA+ diet ,  instructions in Spanish given. --No NSAIDs If needed can takeTylenol up to 2 grams / day --Remain abstinent from alcohol --He needs HBV vaccinations which I started today. I misread HAV ab and ordered Twinrix ( got a dose today). I know see he is actually immune to hepatitis A.  Next dose will be for hepatitis B only.   --Follow up with Dr. Russella Dar in 3 months. He can decide on whether there is need for a liver biopsy based on recent serum autoimmune markers.  --Unfortunately I think he has poor insight regarding diagnosis / prognosis. Through the interpreter I tried to explain the seriousness of his illness. He is currently self -pay. I recommend he try and get insurance if at all possible since at some point he could be referred for transplant evaluation if remains abstinent from [place].   # Mildly elevated tTg IgA. However, AGA IgA and gliadin IgG are both normal. Normal appearing duodenum on recent EGD.    # Diastolic heart failure , grade I. PCP has made referral to Cardiology  HISTORY OF PRESENT ILLNESS    Chief Complaint : hospital  follow up  Donald Briggs is a 45 y.o. male known to Dr. Russella Dar ( recent hospitalization)  with a past medical history significant for cirrhosis, Bell's palsy, Etoh abuse, diastolic heart failure. See PMH below for any additional medical problems.    Patient was hospitalized early June after presenting to the ED with abdominal distention, nausea, vomiting, elevated liver chemistries and history of alcohol abuse.  Abdominal ultrasound showed liver cirrhosis.  CT scan also demonstrated cirrhosis.  He underwent a 2 liter LVP.   SAAG ~ 0.9, not c/w portal htn but calculation compromised by not having serum albumin drawn day of LVP and non-specific # for serum albumin (reported as less than 1).  No evidence for SBP . Maddrey discriminant function did not meet criteria for steroids for possible alcoholic cirrhosis. Inpatient EGD for varices screening showed grade 1 esophageal varices and portal gastropathy  Cirrhosis felt to be secondary to EtOH.  Acute hepatitis serologies negative. Serologic workup : Negative AMA and Alpha 1 antitrypsin. negative ceruloplasmin, normal ferritin, HAV total Ab is positive (likely the IgG) . HBsAg negative. HCV Ab negative.  ANA is positive, ASMA weakly positive and IgG elevated at 2671.  Cytology negative for malignancy. AFP 2.8  INTERVAL HISTORY:  Here (with Spanish interpreter ) for hospital follow up. He feels okay. No nausea, vomiting nor abdominal pain  Having at least one BM daily depending how much he eats. He is taking " 3 medications"  but unsure what they are. He is not following following a low sodium diet.    PREVIOUS ENDOSCOPIC EVALUATIONS / PERTINENT STUDIES:   June 2022 Echocardiogram 1. Left ventricular ejection fraction, by estimation, is 55 to 60%. The left ventricle has normal function. The left ventricle has no regional wall motion abnormalities. Left ventricular diastolic parameters are consistent with Grade I diastolic dysfunction (impaired  relaxation). 2. Right ventricular systolic function is normal. The right ventricular size is normal. Tricuspid regurgitation signal is inadequate for assessing PA pressure. 3. The mitral valve is degenerative. No evidence of mitral valve regurgitation. No evidence of mitral stenosis. 4. The aortic valve is tricuspid. Aortic valve regurgitation is not visualized. Mild aortic valve sclerosis is present, with no evidence of aortic valve stenosis. 5. The inferior vena cava is normal in size with greater than 50% respiratory variability, suggesting right atrial pressure of 3 mmHg.  June 2022 non con CTAP IMPRESSION: Patent portal vein. Cirrhosis with mild hepatic steatosis. Heterogeneous early arterial effusion in the posterior right hepatic lobe suggests an early vascular shunt in this region. No findings suspicious for HCC.  Mild splenomegaly.  Moderate abdominal ascites.  June 2022 EGD for nausea, vomiting, coffee-ground emesis and varices screening Grade 1 esophageal varices.  Severe portal hypertensive gastropathy with oozing.  Normal duodenal bulb and second portion of the duodenum      Past Medical History:  Diagnosis Date   Alcohol abuse    Back pain    Bell's palsy    Tobacco use     Current Medications, Allergies, Past Surgical History, Family History and Social History were reviewed in [employer] record.   Current Outpatient Medications  Medication Sig Dispense Refill   furosemide (LASIX) 40 MG tablet Take 1 tablet (40 mg total) by mouth daily. 30 tablet 0   omeprazole (PRILOSEC) 40 MG capsule Take 1 capsule (40 mg total) by mouth daily. 30 capsule 0   spironolactone (ALDACTONE) 50 MG tablet Take 1 tablet (50 mg total) by mouth daily. 30 tablet 0   No current facility-administered medications for this visit.    Review of Systems: No chest pain. No shortness of breath. No urinary complaints.   PHYSICAL EXAM :    Wt Readings from Last 3  Encounters:  02/04/21 130 lb 1.1 oz (59 kg)  11/11/18 154 lb 3.2 oz (69.9 kg)  09/30/18 155 lb 6.4 oz (70.5 kg)    BP (!) 90/56   Pulse 91   Ht 5\' 4"  (1.626 m)   Wt 132 lb (59.9 kg)   BMI 22.66 kg/m  Constitutional:  Pleasant thin male in no acute distress. Psychiatric: Normal mood and affect. Behavior is normal. EENT: Pupils normal.  Conjunctivae are normal. No scleral icterus. Neck supple.  Cardiovascular: Normal rate, regular rhythm. No edema Pulmonary/chest: Effort normal and breath sounds normal. No wheezing, rales or rhonchi. Abdominal: Soft, nondistended, nontender. Bowel sounds active throughout. There are no masses palpable. No hepatomegaly. Neurological: Alert and oriented to person place and time( evaluation by interpreter), +  Mild asterixis.  Skin: Skin is warm and dry. No rashes noted. Multiple tatoos  , NP  03/08/2021, 8:23 AM

## 2021-03-08 NOTE — Telephone Encounter (Signed)
Inbound call from patient regarding medication. States his pharmacy is [facility] on [address]

## 2021-03-08 NOTE — Patient Instructions (Addendum)
You have been given your first in the series of Hep A/B Vaccines today, your next scheduled vaccine is scheduled on 04/10/2021 at 11:10am  Your provider has requested that you go to the basement level for lab work before leaving today. Press "B" on the elevator. The lab is located at the first door on the left as you exit the elevator.   NO Alcohol  We will refill your medications today  NO NSAIDS , You can take Tylenol 2 gm daily if needed    Your follow up appointment with Dr Russella Dar is 06/06/2021 at 2:30pm    Due to recent changes in healthcare laws, you may see the results of your imaging and laboratory studies on MyChart before your provider has had a chance to review them.  We understand that in some cases there may be results that are confusing or concerning to you. Not all laboratory results come back in the same time frame and the provider may be waiting for multiple results in order to interpret others.  Please give [place] 48 hours in order for your provider to thoroughly review all the results before contacting the office for clarification of your results.    If you are age 21 or older, your body mass index should be between 23-30. Your Body mass index is 22.66 kg/m. If this is out of the aforementioned range listed, please consider follow up with your Primary Care Provider.  If you are age 46 or younger, your body mass index should be between 19-25. Your Body mass index is 22.66 kg/m. If this is out of the aformentioned range listed, please consider follow up with your Primary Care Provider.   __________________________________________________________  The [facility] providers would like to encourage you to use [facility] to communicate with providers for non-urgent requests or questions.  Due to long hold times on the telephone, sending your provider a message by [facility] may be a faster and more efficient way to get a response.  Please allow 48 business hours for a response.  Please  remember that this is for non-urgent requests.    I appreciate the  opportunity to care for you  Thank You   Midge Minium

## 2021-03-08 NOTE — Progress Notes (Signed)
Reviewed and agree with management plan.  Dirck Butch T. Ayisha Pol, MD FACG 

## 2021-03-08 NOTE — Telephone Encounter (Signed)
All refills resubmitted to [facility] on [address]

## 2021-03-15 ENCOUNTER — Other Ambulatory Visit: Payer: Self-pay

## 2021-03-15 ENCOUNTER — Other Ambulatory Visit: Payer: Self-pay | Admitting: Nurse Practitioner

## 2021-03-15 ENCOUNTER — Ambulatory Visit: Payer: Self-pay | Attending: Nurse Practitioner | Admitting: Nurse Practitioner

## 2021-03-15 ENCOUNTER — Telehealth: Payer: Self-pay | Admitting: Nurse Practitioner

## 2021-03-15 NOTE — Telephone Encounter (Signed)
Person who answered phone states he is not available and this is not his phone number. [phone] (Home Phone)

## 2021-03-15 NOTE — Telephone Encounter (Signed)
No answer. Unable to LVM  [employer] [phone]

## 2021-03-15 NOTE — Telephone Encounter (Signed)
Medication Refill - Medication: spironolactone (ALDACTONE) 50 MG  tablet, and furosemide (LASIX) 40 MG tablet  [phone]  Has the patient contacted their pharmacy? yesgent: If no, request that the patient contact the pharmacy for the refill.) (Agent: If yes, when and what did the pharmacy advise?)contact pcp  Preferred Pharmacy (with phone number or street name): [address] [place], [place] [id number]) [phone]  Agent: Please be advised that RX refills may take up to 3 business days. We ask that you follow-up with your pharmacy.

## 2021-03-22 ENCOUNTER — Other Ambulatory Visit: Payer: Self-pay | Admitting: *Deleted

## 2021-03-22 DIAGNOSIS — K7031 Alcoholic cirrhosis of liver with ascites: Secondary | ICD-10-CM

## 2021-03-22 DIAGNOSIS — Z23 Encounter for immunization: Secondary | ICD-10-CM

## 2021-04-10 ENCOUNTER — Telehealth: Payer: Self-pay

## 2021-04-10 ENCOUNTER — Other Ambulatory Visit: Payer: Self-pay

## 2021-04-10 MED ORDER — HEPATITIS B VAC RECOMBINANT 10 MCG/0.5ML IM INJ: 0.5000 mL | INJECTION | Freq: Once | INTRAMUSCULAR | 1 refills | Status: AC

## 2021-04-10 NOTE — Telephone Encounter (Signed)
Patient came today for his 2nd hep B injection. Unfortunately, he currently has no insurance and we [facility]. He will need to continue the hepatis B series there as this will be more cost effective for him he states he is in process of getting financial help with [facility]

## 2021-04-10 NOTE — Progress Notes (Unsigned)
.  h 

## 2021-04-26 ENCOUNTER — Telehealth: Payer: Self-pay | Admitting: Nurse Practitioner

## 2021-04-26 ENCOUNTER — Other Ambulatory Visit: Payer: Self-pay

## 2021-04-26 ENCOUNTER — Ambulatory Visit: Payer: Self-pay | Attending: Nurse Practitioner | Admitting: Nurse Practitioner

## 2021-04-26 NOTE — Telephone Encounter (Signed)
NO answer. LVM with help of interpreter ID # [id number] [phone]

## 2021-06-06 ENCOUNTER — Ambulatory Visit: Payer: Self-pay | Admitting: Gastroenterology
